# Patient Record
Sex: Female | Born: 1935 | Race: White | Hispanic: No | Smoking: Former smoker
Health system: Southern US, Community
[De-identification: ages and names within clinical notes are randomized; demographics above are authoritative.]

## PROBLEM LIST (undated history)

## (undated) DIAGNOSIS — I10 Essential (primary) hypertension: Secondary | ICD-10-CM

## (undated) DIAGNOSIS — Z950 Presence of cardiac pacemaker: Secondary | ICD-10-CM

## (undated) DIAGNOSIS — I442 Atrioventricular block, complete: Secondary | ICD-10-CM

## (undated) DIAGNOSIS — I259 Chronic ischemic heart disease, unspecified: Secondary | ICD-10-CM

## (undated) DIAGNOSIS — R011 Cardiac murmur, unspecified: Secondary | ICD-10-CM

## (undated) DIAGNOSIS — R0989 Other specified symptoms and signs involving the circulatory and respiratory systems: Secondary | ICD-10-CM

## (undated) DIAGNOSIS — I509 Heart failure, unspecified: Secondary | ICD-10-CM

## (undated) DIAGNOSIS — F0390 Unspecified dementia without behavioral disturbance: Secondary | ICD-10-CM

## (undated) HISTORY — DX: Other specified symptoms and signs involving the circulatory and respiratory systems: R09.89

## (undated) HISTORY — DX: Atrioventricular block, complete: I44.2

## (undated) HISTORY — DX: Presence of cardiac pacemaker: Z95.0

## (undated) HISTORY — DX: Cardiac murmur, unspecified: R01.1

## (undated) HISTORY — PX: EP IMPLANTABLE DEVICE: SHX172B

## (undated) HISTORY — PX: ABDOMINAL HYSTERECTOMY: SHX81

## (undated) HISTORY — PX: MOLE REMOVAL: SHX2046

## (undated) HISTORY — DX: Chronic ischemic heart disease, unspecified: I25.9

---

## 2010-11-30 DIAGNOSIS — H47019 Ischemic optic neuropathy, unspecified eye: Secondary | ICD-10-CM | POA: Insufficient documentation

## 2010-11-30 DIAGNOSIS — I447 Left bundle-branch block, unspecified: Secondary | ICD-10-CM | POA: Insufficient documentation

## 2010-11-30 DIAGNOSIS — E785 Hyperlipidemia, unspecified: Secondary | ICD-10-CM | POA: Insufficient documentation

## 2010-11-30 DIAGNOSIS — K5790 Diverticulosis of intestine, part unspecified, without perforation or abscess without bleeding: Secondary | ICD-10-CM | POA: Insufficient documentation

## 2010-11-30 DIAGNOSIS — Z9849 Cataract extraction status, unspecified eye: Secondary | ICD-10-CM | POA: Insufficient documentation

## 2010-11-30 DIAGNOSIS — M810 Age-related osteoporosis without current pathological fracture: Secondary | ICD-10-CM | POA: Insufficient documentation

## 2010-11-30 DIAGNOSIS — I10 Essential (primary) hypertension: Secondary | ICD-10-CM | POA: Insufficient documentation

## 2010-11-30 DIAGNOSIS — I2510 Atherosclerotic heart disease of native coronary artery without angina pectoris: Secondary | ICD-10-CM | POA: Insufficient documentation

## 2013-10-16 ENCOUNTER — Encounter: Payer: Self-pay | Admitting: Cardiology

## 2013-10-16 ENCOUNTER — Ambulatory Visit (INDEPENDENT_AMBULATORY_CARE_PROVIDER_SITE_OTHER): Payer: Medicare Other | Admitting: *Deleted

## 2013-10-16 ENCOUNTER — Ambulatory Visit (INDEPENDENT_AMBULATORY_CARE_PROVIDER_SITE_OTHER): Payer: Medicare Other | Admitting: Cardiology

## 2013-10-16 VITALS — BP 118/62 | HR 60 | Ht 63.0 in | Wt 108.0 lb

## 2013-10-16 DIAGNOSIS — Z950 Presence of cardiac pacemaker: Secondary | ICD-10-CM

## 2013-10-16 DIAGNOSIS — I358 Other nonrheumatic aortic valve disorders: Secondary | ICD-10-CM

## 2013-10-16 DIAGNOSIS — I442 Atrioventricular block, complete: Secondary | ICD-10-CM

## 2013-10-16 DIAGNOSIS — I359 Nonrheumatic aortic valve disorder, unspecified: Secondary | ICD-10-CM | POA: Insufficient documentation

## 2013-10-16 DIAGNOSIS — R0989 Other specified symptoms and signs involving the circulatory and respiratory systems: Secondary | ICD-10-CM | POA: Insufficient documentation

## 2013-10-16 DIAGNOSIS — I259 Chronic ischemic heart disease, unspecified: Secondary | ICD-10-CM

## 2013-10-16 LAB — MDC_IDC_ENUM_SESS_TYPE_INCLINIC
Battery Voltage: 2.75 V
Date Time Interrogation Session: 20150902162709
Lead Channel Pacing Threshold Amplitude: 1 V
Lead Channel Pacing Threshold Pulse Width: 0.5 ms
Lead Channel Sensing Intrinsic Amplitude: 0.8 mV
Lead Channel Setting Pacing Pulse Width: 0.5 ms
MDC IDC MSMT BATTERY IMPEDANCE: 2300 Ohm
MDC IDC MSMT LEADCHNL RV IMPEDANCE VALUE: 469 Ohm
MDC IDC PG SERIAL: 1785931
MDC IDC SET LEADCHNL RV SENSING SENSITIVITY: 2 mV

## 2013-10-16 NOTE — Patient Instructions (Addendum)
Your physician has requested that you have an echocardiogram. Echocardiography is a painless test that uses sound waves to create images of your heart. It provides your doctor with information about the size and shape of your heart and how well your heart's chambers and valves are working. This procedure takes approximately one hour. There are no restrictions for this procedure.  A chest x-ray takes a picture of the organs and structures inside the chest, including the heart, lungs, and blood vessels. This test can show several things, including, whether the heart is enlarges; whether fluid is building up in the lungs; and whether pacemaker / defibrillator leads are still in place. [facility] AT [facility]  Your physician has requested that you have a carotid duplex. This test is an ultrasound of the carotid arteries in your neck. It looks at blood flow through these arteries that supply the brain with blood. Allow one hour for this exam. There are no restrictions or special instructions.  Your physician recommends that you continue on your current medications as directed. Please refer to the Current Medication list given to you today.  NEED TO GET SET UP WITH EP PHYSICIAN TO FOLLOW YOUR PACEMAKER  Your physician wants you to follow-up in: 6 month OV/EKG You will receive a reminder letter in the mail two months in advance. If you don't receive a letter, please call our office to schedule the follow-up appointment.

## 2013-10-16 NOTE — Progress Notes (Signed)
Kathy Solomon Date of Birth:  [birth year] [facility] [address] [place], [place]  [address] [phone]        Fax   [phone]   History of Present Illness: This pleasant 80 year old woman is seen at the request of [facility] to establish cardiology care here in the [place] area.  The patient moved to [place] several years ago from [place].  She initially received her cardiology care at [facility] but because of the distance involved wishes to transfer her to [place].  [place]  The patient has a history of ischemic heart disease.  She had a myocardial infarction in 1994 treated with PCI in the [facility]  She has subsequently been on aspirin and Plavix.  She has a past history of sick sinus syndrome and has a St. Jude dual-chamber pacemaker implanted in [place] on 03/17/1998.  She has had her battery changed once since then.  She wishes to establish pacemaker/EP followup here in [place]. She has a past history of a known systolic murmur secondary to mild aortic valve disease.  She also has a past history of a known left carotid bruit previous carotid duplex in 2012 showing no significant stenosis. The patient has not been experiencing any recent cardiac symptoms.  She denies any chest pain or shortness of breath.  She walks for 30 minutes daily with a neighbor friend in the neighborhood or exercise.  She has occasional ankle edema.  She has not been having a dizziness or syncope.  She has not been having any racing of her heart or tachycardia.  She has had no TIA symptoms.  Her appetite is good and her weight has been stable. She quit drinking alcohol 25 years ago.  She quit smoking many years ago when she had her heart attack. Social history reveals that she moved to [place] to be near her daughter.  Her husband died of leukemia about  4 years ago.  Current Outpatient Prescriptions  Medication Sig Dispense Refill  . acetaminophen (TYLENOL) 500 MG tablet Take by mouth every 4 (four) hours as needed.       Marland Kitchen amLODipine (NORVASC) 10 MG tablet Take 10 mg by mouth daily.       Marland Kitchen aspirin 81 MG tablet Take 81 mg by mouth daily.       Marland Kitchen atenolol (TENORMIN) 100 MG tablet TAKE 1 TABLET BY MOUTH EVERY DAY      . Cholecalciferol (VITAMIN D) 400 UNITS capsule Take 1 tablet by mouth.      . clopidogrel (PLAVIX) 75 MG tablet Take 75 mg by mouth once.       Marland Kitchen lisinopril (PRINIVIL,ZESTRIL) 40 MG tablet Take 40 mg by mouth daily.       . Omega-3 Fatty Acids (FISH OIL) 1000 MG CAPS Take 1 capsule by mouth.      . simvastatin (ZOCOR) 20 MG tablet Take 20 mg by mouth daily at 6 PM.       . vitamin E 100 UNIT capsule Take 100 Units by mouth daily.        No current facility-administered medications for this visit.    Allergies not on file  Patient Active Problem List   Diagnosis Date Noted  . Ischemic heart disease 10/16/2013  . Pacemaker 10/16/2013  . Heart murmur, aortic 10/16/2013  . Left carotid bruit 10/16/2013    History  Smoking status  . Former Smoker  . Quit date: 05/16/1993  Smokeless tobacco  . Not on file    History  Alcohol Use: Not on file    Family history reveals her father died at 23 and mother died at 70.  Neither of them had coronary disease.  Review of Systems: Constitutional: no fever chills diaphoresis or fatigue or change in weight.  Head and neck: no hearing loss, no epistaxis, no photophobia or visual disturbance. Respiratory: No cough, shortness of breath or wheezing. Cardiovascular: No chest pain peripheral edema, palpitations. Gastrointestinal: No abdominal distention, no abdominal pain, no change in bowel habits hematochezia or melena. Genitourinary: No dysuria, no frequency, no urgency, no nocturia. Musculoskeletal:No arthralgias, no back pain, no gait disturbance or myalgias. Neurological:  No dizziness, no headaches, no numbness, no seizures, no syncope, no weakness, no tremors. Hematologic: No lymphadenopathy, no easy bruising. Psychiatric: No confusion, no hallucinations, no sleep disturbance.    Physical Exam: Filed Vitals:   10/16/13 1450  BP: 118/62  Pulse: 60  The patient appears to be in no distress.  Her daughter accompanied her to the office visit today  Head and neck exam reveals that the pupils are equal and reactive.  The extraocular movements are full.  There is no scleral icterus.  Mouth and pharynx are benign.  No lymphadenopathy.  There is a moderate left carotid systolic bruit.  The jugular venous pressure is normal.  Thyroid is not enlarged or tender.  Chest is clear to percussion and auscultation.  No rales or rhonchi.  Expansion of the chest is symmetrical.  The dual-chamber pacemaker is in the right upper chest.  Heart reveals no abnormal lift or heave.  First and second heart sounds are normal.  There is a grade 2/6 systolic ejection murmur at the aortic area.  The abdomen is soft and nontender.  Bowel sounds are normoactive.  There is no hepatosplenomegaly or mass.  There are no abdominal bruits.  Extremities reveal no phlebitis.  There is mild ankle edema probably secondary to amlodipine.  Pedal pulses are good.  There is no cyanosis or clubbing.  Neurologic exam is normal strength and no lateralizing weakness.  No sensory deficits.  Integument reveals no rash  EKG shows sinus bradycardia with paced ventricular rhythm  Assessment / Plan: 1. ischemic heart disease status post acute myocardial infarction with PCI in 1994.  No subsequent angina. 2. asymptomatic left carotid bruit 3. murmur of aortic stenosis 4. history of sick sinus syndrome with functioning dual-chamber pacemaker (St. Jude) implanted in 2000.  Patient uncertain when the generator was changed.  Plan: Continue current cardiac meds.  We will update her echocardiogram in regard to  her aortic stenosis.  We will update her carotid duplex in regard to her left carotid bruit.  We will arrange for her to be seen by EP to arrange for appropriate long-term pacemaker followup. We will check a chest x-ray. Recheck for an office visit and EKG in 6 months

## 2013-10-17 NOTE — Progress Notes (Signed)
Pacemaker check in clinic (checked by Delsa Grana). Normal device function. Threshold, sensing, and impedance consistent with previous measurements. Device programmed to maximize longevity. 25 mode switches (<1%)--max A 197--no EGMs. 1 high ventricular rates noted. 868 "PMT" episodes. Device programmed at appropriate safety margins. Histogram distribution appropriate for patient activity level. Device programmed to optimize intrinsic conduction. Estimated longevity 2-2.75 years. Patient will follow up with GT in 3 months to establish.

## 2013-10-29 ENCOUNTER — Other Ambulatory Visit (HOSPITAL_COMMUNITY): Payer: Medicare Other

## 2013-10-29 ENCOUNTER — Encounter (HOSPITAL_COMMUNITY): Payer: Medicare Other

## 2013-10-30 ENCOUNTER — Telehealth: Payer: Self-pay | Admitting: *Deleted

## 2013-10-30 NOTE — Telephone Encounter (Signed)
Message copied by Burnell Blanks on Wed Oct 30, 2013  8:29 AM ------      Message from: Omar Person B      Created: Wed Oct 16, 2013  4:31 PM      Regarding: tests       10-29-13 @ 10:30  Echo      10-29-13 @ 11:30 cartiod            -MEDICARE/MEDICARE PART A AND B         Cvg status: E-Verified         Subscriber: Aaronson,Caidyn         E-BLUE CROSS BLUE SHIELD/BCBS PPO OUT OF STATE         Cvg status: E-Verified         Subscriber: Aries,Patrese                      ------

## 2013-10-30 NOTE — Telephone Encounter (Signed)
OK 

## 2013-10-30 NOTE — Telephone Encounter (Signed)
Per scheduling desk daughter cancelled

## 2013-11-04 ENCOUNTER — Encounter: Payer: Self-pay | Admitting: Internal Medicine

## 2014-01-08 ENCOUNTER — Encounter: Payer: Self-pay | Admitting: *Deleted

## 2014-01-13 ENCOUNTER — Encounter: Payer: Self-pay | Admitting: *Deleted

## 2014-01-15 ENCOUNTER — Encounter: Payer: Self-pay | Admitting: Internal Medicine

## 2014-01-15 ENCOUNTER — Ambulatory Visit (INDEPENDENT_AMBULATORY_CARE_PROVIDER_SITE_OTHER): Payer: Medicare Other | Admitting: Internal Medicine

## 2014-01-15 VITALS — BP 168/70 | HR 61 | Ht 63.0 in | Wt 110.0 lb

## 2014-01-15 DIAGNOSIS — Z45018 Encounter for adjustment and management of other part of cardiac pacemaker: Secondary | ICD-10-CM

## 2014-01-15 DIAGNOSIS — I442 Atrioventricular block, complete: Secondary | ICD-10-CM

## 2014-01-15 DIAGNOSIS — I259 Chronic ischemic heart disease, unspecified: Secondary | ICD-10-CM

## 2014-01-15 DIAGNOSIS — I495 Sick sinus syndrome: Secondary | ICD-10-CM

## 2014-01-15 DIAGNOSIS — I10 Essential (primary) hypertension: Secondary | ICD-10-CM

## 2014-01-15 NOTE — Patient Instructions (Signed)

## 2014-01-15 NOTE — Progress Notes (Signed)
      No care team member to display   HPI  Kathy BravoMarion Solomon is a 80 y.o. female Seen to establish pacemaker follow-up for device implanted in [place] for complete hart block originally in 2000 with one interval generator replacement. She has a history of coronary artery disease with prior MI treated with PCI [facility] in [place]. She has known mild aortic valve disease.  An echo was ordered but not comsummated  she has no desire to undertake testing at her age. She denies shortness of breath or chest discomfort or peripheral edema  Blood pressures at home are in the 1:30-140 range   Old notes not availavbe   Past Medical History  Diagnosis Date  . Ischemic heart disease   . Heart murmur   . Left carotid bruit     Past Surgical History  Procedure Laterality Date  . Abdominal hysterectomy    . Ep implantable device      Current Outpatient Prescriptions  Medication Sig Dispense Refill  . acetaminophen (TYLENOL) 500 MG tablet Take by mouth every 4 (four) hours as needed.     Marland Kitchen. amLODipine (NORVASC) 10 MG tablet Take 10 mg by mouth daily.     Marland Kitchen. atenolol (TENORMIN) 100 MG tablet TAKE 1 TABLET BY MOUTH EVERY DAY    . Cholecalciferol (VITAMIN D) 400 UNITS capsule Take 1 tablet by mouth.    . clopidogrel (PLAVIX) 75 MG tablet Take 75 mg by mouth once.     Marland Kitchen. lisinopril (PRINIVIL,ZESTRIL) 40 MG tablet Take 40 mg by mouth daily.     . Omega-3 Fatty Acids (FISH OIL) 1000 MG CAPS Take 1 capsule by mouth.    . simvastatin (ZOCOR) 20 MG tablet Take 20 mg by mouth daily at 6 PM.     . vitamin E 100 UNIT capsule Take 100 Units by mouth daily.      No current facility-administered medications for this visit.    Allergies  Allergen Reactions  . Clonidine Derivatives Nausea Only and Anxiety     "feel like a zombie"   Soc hx  Moved to [place] following death of husband to be near her daughter and one grandson  No smoking  Fhx neg for compelte heart block Review of  Systems negative except from HPI and PMH  Physical Exam BP 168/70 mmHg  Pulse 61  Ht 5\' 3"  (1.6 m)  Wt 110 lb (49.896 kg)  BMI 19.49 kg/m2 Well developed and well nourished in no acute distress HENT normal E scleral and icterus clear Neck Supple JVP flat; carotids brisk and full Clear to ausculation Device pocket well healed; without hematoma or erythema.  There is no tethering *Regular rate and rhythm, no murmurs gallops or rub Soft with active bowel sounds No clubbing cyanosis  Edema Alert and oriented, grossly normal motor and sensory function Skin Warm and Dry  ECG demonstrates P synchronous pacing   Assessment and  Plan  Complete heart block  Pacemaker-St. Jude-VDD lead   Hypertension  We will establish tela- trace follow-up for her device. We'll plan to see her in one year.  Blood pressures at home are in the reasonable range. She is reminded that if the INR is 135-145 range consistently she should consider augmenting antihypertensive therapy

## 2014-01-16 LAB — MDC_IDC_ENUM_SESS_TYPE_INCLINIC
Battery Voltage: 2.74 V
Brady Statistic RV Percent Paced: 99 %
Date Time Interrogation Session: 20151202160604
Implantable Pulse Generator Model: 5826
Implantable Pulse Generator Serial Number: 1785931
Lead Channel Impedance Value: 451 Ohm
Lead Channel Pacing Threshold Amplitude: 1.125 V
Lead Channel Setting Sensing Sensitivity: 2 mV
MDC IDC MSMT BATTERY IMPEDANCE: 2500 Ohm
MDC IDC MSMT LEADCHNL RV PACING THRESHOLD PULSEWIDTH: 0.5 ms
MDC IDC SET LEADCHNL RV PACING PULSEWIDTH: 0.5 ms

## 2014-01-23 LAB — MDC_IDC_ENUM_SESS_TYPE_INCLINIC
Battery Impedance: 2500 Ohm
Battery Voltage: 2.74 V
Brady Statistic RV Percent Paced: 99 %
Date Time Interrogation Session: 20151202160604
Implantable Pulse Generator Model: 5826
Implantable Pulse Generator Serial Number: 1785931
Lead Channel Impedance Value: 451 Ohm
Lead Channel Pacing Threshold Pulse Width: 0.5 ms
Lead Channel Setting Pacing Pulse Width: 0.5 ms
Lead Channel Setting Sensing Sensitivity: 2 mV
MDC IDC MSMT LEADCHNL RA SENSING INTR AMPL: 0.7 mV
MDC IDC MSMT LEADCHNL RV PACING THRESHOLD AMPLITUDE: 1.125 V

## 2014-03-12 DIAGNOSIS — I442 Atrioventricular block, complete: Secondary | ICD-10-CM

## 2014-04-16 ENCOUNTER — Encounter: Payer: Self-pay | Admitting: Cardiology

## 2014-04-16 ENCOUNTER — Ambulatory Visit (INDEPENDENT_AMBULATORY_CARE_PROVIDER_SITE_OTHER): Payer: Medicare Other | Admitting: Cardiology

## 2014-04-16 VITALS — BP 156/60 | HR 61 | Ht 63.0 in | Wt 112.8 lb

## 2014-04-16 DIAGNOSIS — I495 Sick sinus syndrome: Secondary | ICD-10-CM

## 2014-04-16 DIAGNOSIS — I359 Nonrheumatic aortic valve disorder, unspecified: Secondary | ICD-10-CM

## 2014-04-16 DIAGNOSIS — I259 Chronic ischemic heart disease, unspecified: Secondary | ICD-10-CM

## 2014-04-16 DIAGNOSIS — I358 Other nonrheumatic aortic valve disorders: Secondary | ICD-10-CM

## 2014-04-16 DIAGNOSIS — R0989 Other specified symptoms and signs involving the circulatory and respiratory systems: Secondary | ICD-10-CM

## 2014-04-16 NOTE — Progress Notes (Signed)
Cardiology Office Note   Date:  04/16/2014   ID:  Kathy Solomon, DOB [birth year], MRN [id number]  PCP:  No primary care provider on file.  Cardiologist:   Cassell Clement, MD   No chief complaint on file.     History of Present Illness: Kathy Solomon is a 81 y.o. female who presents for a six-month follow-up office visit This pleasant 81 year old woman was initially seen at the request of [facility] to establish cardiology care here in the [place] area. The patient moved to [place] several years ago from [place]. She initially received her cardiology care at [facility] but because of the distance involved wishes to transfer her to [place]. [place] The patient has a history of ischemic heart disease. She had a myocardial infarction in 1994 treated with PCI in the [facility]. She has subsequently been on aspirin and Plavix. She has a past history of sick sinus syndrome and has a St. Jude dual-chamber pacemaker implanted in [place] on 03/17/1998. She has had her battery changed once since then.Her pacemaker is followed by Dr. Graciela Husbands She has a past history of a known systolic murmur secondary to mild aortic valve disease. She also has a past history of a known left carotid bruit previous carotid duplex in 2012 showing no significant stenosis.  She has not been having any TIA symptoms.  The patient has not been experiencing any recent cardiac symptoms. She denies any chest pain or shortness of breath. She walks for 30 minutes daily with a neighbor friend in the neighborhood or exercise. She has occasional ankle edema. She has not been having a dizziness or syncope. She has not been having any racing of her heart or tachycardia. She has had no TIA symptoms. Her appetite is good and her weight has been stable. She quit drinking alcohol 25 years ago. She  quit smoking many years ago when she had her heart attack. Social history reveals that she moved to [place] to be near her daughter.Her daughter drives her to her appointments. Her husband died of leukemia about 4 years ago. Her blood pressure is slightly high today.  She normally checks it at the drug store and it has been running normal most of the time.   Past Medical History  Diagnosis Date  . Ischemic heart disease   . Heart murmur   . Left carotid bruit   . Complete heart block   . Pacemaker - St Judes     VDD Lead     Past Surgical History  Procedure Laterality Date  . Abdominal hysterectomy    . Ep implantable device       Current Outpatient Prescriptions  Medication Sig Dispense Refill  . acetaminophen (TYLENOL) 500 MG tablet Take by mouth every 4 (four) hours as needed.     Marland Kitchen amLODipine (NORVASC) 10 MG tablet Take 10 mg by mouth daily.     Marland Kitchen atenolol (TENORMIN) 100 MG tablet TAKE 1 TABLET BY MOUTH EVERY DAY    . Cholecalciferol (VITAMIN D) 400 UNITS capsule Take 1 tablet by mouth.    . clopidogrel (PLAVIX) 75 MG tablet Take 75 mg by mouth once.     Marland Kitchen lisinopril (PRINIVIL,ZESTRIL) 40 MG tablet Take 40 mg by mouth daily.     . Omega-3 Fatty Acids (FISH OIL) 1000 MG CAPS Take 1 capsule by mouth.    . simvastatin (ZOCOR) 20 MG  tablet Take 20 mg by mouth daily at 6 PM.     . vitamin E 100 UNIT capsule Take 100 Units by mouth daily.      No current facility-administered medications for this visit.    Allergies:   Clonidine derivatives    Social History:  The patient  reports that she quit smoking about 20 years ago. She does not have any smokeless tobacco history on file.   Family History:  The patient's family history includes Hypertension in her father and mother.    ROS:  Please see the history of present illness.   Otherwise, review of systems are positive for none.   All other systems are reviewed and negative.    PHYSICAL EXAM: VS:  BP 156/60 mmHg   Pulse 61  Ht 5\' 3"  (1.6 m)  Wt 112 lb 12.8 oz (51.166 kg)  BMI 19.99 kg/m2 , BMI Body mass index is 19.99 kg/(m^2). GEN: Well nourished, well developed, in no acute distress HEENT: normal Neck: no JVD, carotid bruits, or masses Cardiac: RRR; no murmurs, rubs, or gallops,no edema  Respiratory:  clear to auscultation bilaterally, normal work of breathing GI: soft, nontender, nondistended, + BS MS: no deformity or atrophy Skin: warm and dry, no rash Neuro:  Strength and sensation are intact Psych: euthymic mood, full affect   EKG:  EKG is not ordered today.    Recent Labs: No results found for requested labs within last 365 days.    Lipid Panel No results found for: CHOL, TRIG, HDL, CHOLHDL, VLDL, LDLCALC, LDLDIRECT    Wt Readings from Last 3 Encounters:  04/16/14 112 lb 12.8 oz (51.166 kg)  01/15/14 110 lb (49.896 kg)  10/16/13 108 lb (48.988 kg)         ASSESSMENT AND PLAN:  1. ischemic heart disease status post acute myocardial infarction with PCI in 1994. No subsequent angina. 2. asymptomatic left carotid bruit 3. murmur of aortic stenosis, asymptomatic 4. history of sick sinus syndrome with functioning dual-chamber pacemaker (St. Jude) implanted in 2000. Patient uncertain when the generator was changed.  She has been told that her pacemaker probably has another 2 or 3 years of battery life.   Current medicines are reviewed at length with the patient today.  The patient does not have concerns regarding medicines.  The following changes have been made:  no change  Labs/ tests ordered today include:  No orders of the defined types were placed in this encounter.     Disposition:   FU with Dr. Patty SermonsBrackbill in 6 months office visit and EKG Signed, Cassell Clementhomas Nykiah Ma, MD  04/16/2014 1:23 PM    [facility] [address], [place], [place]  [address] Phone: [phone](336) [phone]; Fax: [phone]) [phone]

## 2014-04-16 NOTE — Patient Instructions (Signed)
Your physician recommends that you continue on your current medications as directed. Please refer to the Current Medication list given to you today.  Your physician wants you to follow-up in: 6 MONTH OV /EKG You will receive a reminder letter in the mail two months in advance. If you don't receive a letter, please call our office to schedule the follow-up appointment.  

## 2014-06-06 ENCOUNTER — Emergency Department (HOSPITAL_COMMUNITY)
Admission: EM | Admit: 2014-06-06 | Discharge: 2014-06-06 | Disposition: A | Discharge status: Home / Self Care | Payer: Medicare Other | Attending: Emergency Medicine | Admitting: Emergency Medicine

## 2014-06-06 ENCOUNTER — Encounter (HOSPITAL_COMMUNITY): Payer: Self-pay | Admitting: Emergency Medicine

## 2014-06-06 DIAGNOSIS — Z7982 Long term (current) use of aspirin: Secondary | ICD-10-CM | POA: Diagnosis not present

## 2014-06-06 DIAGNOSIS — Z7901 Long term (current) use of anticoagulants: Secondary | ICD-10-CM | POA: Diagnosis not present

## 2014-06-06 DIAGNOSIS — R001 Bradycardia, unspecified: Secondary | ICD-10-CM | POA: Diagnosis not present

## 2014-06-06 DIAGNOSIS — Z950 Presence of cardiac pacemaker: Secondary | ICD-10-CM | POA: Insufficient documentation

## 2014-06-06 DIAGNOSIS — M96830 Postprocedural hemorrhage and hematoma of a musculoskeletal structure following a musculoskeletal system procedure: Secondary | ICD-10-CM | POA: Diagnosis present

## 2014-06-06 DIAGNOSIS — I259 Chronic ischemic heart disease, unspecified: Secondary | ICD-10-CM | POA: Insufficient documentation

## 2014-06-06 DIAGNOSIS — Z9889 Other specified postprocedural states: Secondary | ICD-10-CM | POA: Diagnosis not present

## 2014-06-06 DIAGNOSIS — Y828 Other medical devices associated with adverse incidents: Secondary | ICD-10-CM | POA: Diagnosis not present

## 2014-06-06 DIAGNOSIS — Z87891 Personal history of nicotine dependence: Secondary | ICD-10-CM | POA: Insufficient documentation

## 2014-06-06 DIAGNOSIS — R58 Hemorrhage, not elsewhere classified: Secondary | ICD-10-CM

## 2014-06-06 DIAGNOSIS — Z79899 Other long term (current) drug therapy: Secondary | ICD-10-CM | POA: Diagnosis not present

## 2014-06-06 LAB — COMPREHENSIVE METABOLIC PANEL
ALBUMIN: 3.7 g/dL (ref 3.5–5.2)
ALK PHOS: 60 U/L (ref 39–117)
ALT: 20 U/L (ref 0–35)
ANION GAP: 13 (ref 5–15)
AST: 26 U/L (ref 0–37)
BILIRUBIN TOTAL: 0.5 mg/dL (ref 0.3–1.2)
BUN: 22 mg/dL (ref 6–23)
CALCIUM: 9.4 mg/dL (ref 8.4–10.5)
CO2: 22 mmol/L (ref 19–32)
Chloride: 101 mmol/L (ref 96–112)
Creatinine, Ser: 1.23 mg/dL — ABNORMAL HIGH (ref 0.50–1.10)
GFR calc non Af Amer: 39 mL/min — ABNORMAL LOW (ref >90)
GFR, EST AFRICAN AMERICAN: 45 mL/min — AB (ref >90)
Glucose, Bld: 127 mg/dL — ABNORMAL HIGH (ref 70–99)
Potassium: 3.8 mmol/L (ref 3.5–5.1)
SODIUM: 136 mmol/L (ref 135–145)
TOTAL PROTEIN: 6.7 g/dL (ref 6.0–8.3)

## 2014-06-06 LAB — CBC WITH DIFFERENTIAL/PLATELET
Basophils Absolute: 0 10*3/uL (ref 0.0–0.1)
Basophils Relative: 0 % (ref 0–1)
EOS ABS: 0.2 10*3/uL (ref 0.0–0.7)
EOS PCT: 3 % (ref 0–5)
HEMATOCRIT: 37.9 % (ref 36.0–46.0)
Hemoglobin: 12.7 g/dL (ref 12.0–15.0)
LYMPHS ABS: 1 10*3/uL (ref 0.7–4.0)
Lymphocytes Relative: 17 % (ref 12–46)
MCH: 30.7 pg (ref 26.0–34.0)
MCHC: 33.5 g/dL (ref 30.0–36.0)
MCV: 91.5 fL (ref 78.0–100.0)
MONOS PCT: 13 % — AB (ref 3–12)
Monocytes Absolute: 0.7 10*3/uL (ref 0.1–1.0)
NEUTROS PCT: 67 % (ref 43–77)
Neutro Abs: 3.7 10*3/uL (ref 1.7–7.7)
Platelets: 182 10*3/uL (ref 150–400)
RBC: 4.14 MIL/uL (ref 3.87–5.11)
RDW: 13.1 % (ref 11.5–15.5)
WBC: 5.6 10*3/uL (ref 4.0–10.5)

## 2014-06-06 MED ORDER — LIDOCAINE-EPINEPHRINE 2 %-1:100000 IJ SOLN
20.0000 mL | Freq: Once | INTRAMUSCULAR | Status: AC
  Administered 2014-06-06: 20 mL
  Filled 2014-06-06: qty 20

## 2014-06-06 NOTE — ED Notes (Signed)
Pt. reported bleeding this evening at right lateral face and anterior neck where moles were excised by her dermatologist yesterday , bleeding noted at arrival / dressing applied.

## 2014-06-06 NOTE — Discharge Instructions (Signed)

## 2014-06-06 NOTE — ED Notes (Signed)
EDP at bedside  

## 2014-06-06 NOTE — ED Provider Notes (Signed)
CSN: [id number]     Arrival date & time 06/06/14  0009 History   First MD Initiated Contact with Patient 06/06/14 0049     This chart was scribed for No att. providers found by Arlan OrganAshley Leger, ED Scribe. This patient was seen in room B14C/B14C and the patient's care was started 12:58 AM.   Chief Complaint  Patient presents with  . Post-op Problem   Patient is a 81 y.o. female presenting with general illness. The history is provided by the patient and a relative. No language interpreter was used.  Illness Location:  Bleeding from open wounds on anterior neck Severity:  Mild Onset quality:  Sudden Timing:  Constant Progression:  Unchanged Chronicity:  New Associated symptoms: no abdominal pain, no congestion, no cough, no diarrhea, no fever, no nausea, no rash and no shortness of breath   Risk factors:  Pt is on Plavix   HPI Comments: Marijean BravoMarion Belzer is a 81 y.o. female with a PMHx of ischemic heart disease and implanted St. Jude pacemaker who presents to the Emergency Department here for post-op problem this evening. Pt had 2 moles removed from lateral face and anterior neck yesterday at her dermatologists office. Ms. Cheryl FlashSchuler states she went to bed this evening at approximately 6:30 PM and woke in the middle of the night when she noticed bleeding from her neck at around 8:00 PM. Pt then called her daughter to bring her to emergency department for further evaluation. She is currently on Plavix daily. Pt denies any fever, chills, or abdominal pain. No reported pain at this time. Pt with known allergy to Clonidine.  Past Medical History  Diagnosis Date  . Ischemic heart disease   . Heart murmur   . Left carotid bruit   . Complete heart block   . Pacemaker - St Judes     VDD Lead    Past Surgical History  Procedure Laterality Date  . Abdominal hysterectomy    . Ep implantable device    . Mole excision     Family History  Problem Relation Age of Onset  . Hypertension Mother   .  Hypertension Father    History  Substance Use Topics  . Smoking status: Former Smoker    Quit date: 05/16/1993  . Smokeless tobacco: Not on file  . Alcohol Use: No   OB History    No data available     Review of Systems  Constitutional: Negative for fever.  HENT: Negative for congestion.   Respiratory: Negative for cough and shortness of breath.   Gastrointestinal: Negative for nausea, abdominal pain and diarrhea.  Skin: Positive for wound. Negative for rash.  All other systems reviewed and are negative.     Allergies  Clonidine derivatives  Home Medications   Prior to Admission medications   Medication Sig Start Date End Date Taking? Authorizing Provider  amLODipine (NORVASC) 10 MG tablet Take 10 mg by mouth daily.    Yes Historical Provider, MD  aspirin EC 81 MG tablet Take 81 mg by mouth daily.   Yes Historical Provider, MD  atenolol (TENORMIN) 100 MG tablet TAKE 1 TABLET BY MOUTH EVERY DAY 05/21/13  Yes Historical Provider, MD  Cholecalciferol (VITAMIN D) 400 UNITS capsule Take 1 tablet by mouth daily.    Yes Historical Provider, MD  clopidogrel (PLAVIX) 75 MG tablet Take 75 mg by mouth daily.    Yes Historical Provider, MD  lisinopril (PRINIVIL,ZESTRIL) 40 MG tablet Take 40 mg by mouth daily.  Yes Historical Provider, MD  Multiple Vitamin (MULTIVITAMIN WITH MINERALS) TABS tablet Take 1 tablet by mouth daily.   Yes Historical Provider, MD  Omega-3 Fatty Acids (FISH OIL) 1000 MG CAPS Take 1 capsule by mouth daily.    Yes Historical Provider, MD  simvastatin (ZOCOR) 20 MG tablet Take 20 mg by mouth daily.    Yes Historical Provider, MD   Triage Vitals: BP 143/59 mmHg  Pulse 66  Temp(Src) 98.2 F (36.8 C)  Resp 16  Wt 104 lb 9 oz (47.429 kg)  SpO2 95%   Physical Exam  Constitutional: She is oriented to person, place, and time. She appears well-developed and well-nourished.  HENT:  Head: Normocephalic and atraumatic.  Right Ear: External ear normal.  Left Ear:  External ear normal.  Eyes: Conjunctivae and EOM are normal. Pupils are equal, round, and reactive to light.  Neck: Normal range of motion. Neck supple.  2 venous oozing superficial punctures over anterior neck  Cardiovascular: Normal rate, regular rhythm, normal heart sounds and intact distal pulses.   Pulmonary/Chest: Effort normal and breath sounds normal.  Abdominal: Soft. Bowel sounds are normal. There is no tenderness.  Musculoskeletal: Normal range of motion.  Neurological: She is alert and oriented to person, place, and time.  Skin: Skin is warm and dry.  Vitals reviewed.   ED Course  Procedures (including critical care time)  DIAGNOSTIC STUDIES: Oxygen Saturation is 94% on RA, normal by my interpretation.    COORDINATION OF CARE: 12:57 AM-Discussed treatment plan with pt at bedside and pt agreed to plan.     LACERATION REPAIR Performed by: Augustine Radar MD Consent: Verbal consent obtained. Risks and benefits: risks, benefits and alternatives were discussed Patient identity confirmed: provided demographic data Time out performed prior to procedure Prepped and Draped in normal sterile fashion Wound explored Laceration Location: anterior neck  Laceration Length: 2 x 5mm No Foreign Bodies seen or palpated Anesthesia: local infiltration Local anesthetic: lidocaine 2% with epinephrine Anesthetic total: 8 ml Irrigation method: syringe Amount of cleaning: standard Skin closure: vicryl rapide Number of sutures or staples: 2 Technique: simple interrupted Patient tolerance: Patient tolerated the procedure well with no immediate complications.  Labs Review Labs Reviewed  CBC WITH DIFFERENTIAL/PLATELET - Abnormal; Notable for the following:    Monocytes Relative 13 (*)    All other components within normal limits  COMPREHENSIVE METABOLIC PANEL - Abnormal; Notable for the following:    Glucose, Bld 127 (*)    Creatinine, Ser 1.23 (*)    GFR calc non Af Amer 39 (*)    GFR  calc Af Amer 45 (*)    All other components within normal limits    Imaging Review No results found.   EKG Interpretation None      MDM   Final diagnoses:  Bleeding    81 y.o. female with pertinent PMH of CAD on plavix presents with oozing from biopsy sites on neck as above.  Attempted pressure, which was unsuccessful.  2 sutures placed over biopsy site with control of bleeding.  Discussed strict return precautions for infection.  DC home in stable condition  I have reviewed all laboratory and imaging studies if ordered as above  1. Bleeding           Mirian Mo, MD 06/10/14 [phone]

## 2014-06-06 NOTE — ED Notes (Signed)
Dr. Littie DeedsGentry at bedside applying pressure to stop bleeding

## 2014-06-06 NOTE — ED Notes (Signed)
The tech applied a clean dressing to front of the neck.

## 2014-06-11 ENCOUNTER — Encounter: Payer: Self-pay | Admitting: Internal Medicine

## 2014-06-11 DIAGNOSIS — I442 Atrioventricular block, complete: Secondary | ICD-10-CM | POA: Diagnosis not present

## 2014-07-21 ENCOUNTER — Ambulatory Visit (INDEPENDENT_AMBULATORY_CARE_PROVIDER_SITE_OTHER): Payer: Medicare Other | Admitting: *Deleted

## 2014-07-21 DIAGNOSIS — Z950 Presence of cardiac pacemaker: Secondary | ICD-10-CM | POA: Diagnosis not present

## 2014-07-21 DIAGNOSIS — I495 Sick sinus syndrome: Secondary | ICD-10-CM

## 2014-07-21 LAB — CUP PACEART INCLINIC DEVICE CHECK
Battery Impedance: 3000 Ohm
Battery Voltage: 2.72 V
Brady Statistic RV Percent Paced: 99 %
Lead Channel Setting Pacing Pulse Width: 0.5 ms
Lead Channel Setting Sensing Sensitivity: 2 mV
MDC IDC MSMT LEADCHNL RV IMPEDANCE VALUE: 446 Ohm
MDC IDC MSMT LEADCHNL RV PACING THRESHOLD AMPLITUDE: 1 V
MDC IDC MSMT LEADCHNL RV PACING THRESHOLD PULSEWIDTH: 0.5 ms
MDC IDC SESS DTM: 20160606121229
Pulse Gen Model: 5826
Pulse Gen Serial Number: 1785931

## 2014-07-21 NOTE — Progress Notes (Signed)
Pacemaker check in clinic. Normal device function. Threshold, sensing, impedances consistent with previous measurements. Device programmed to maximize longevity. 6 mode switches--- <1%, longest 8sec. Device programmed at appropriate safety margins. Histogram distribution appropriate for patient activity level. Device programmed to optimize intrinsic conduction. Estimated longevity 1.75-2.51yrs. ROV w/ SK in 63mo.

## 2014-08-05 ENCOUNTER — Telehealth: Payer: Self-pay | Admitting: Cardiology

## 2014-08-05 NOTE — Telephone Encounter (Signed)
New message      Pt called EMT last night because she felt strange.  They said she was ok.  Patient feels fine now except she is tired.  EMS told pt to call her doctor  Please call

## 2014-08-05 NOTE — Telephone Encounter (Signed)
Daughter called EMS to come evaluate patient Daughter stated she spoke with EMS and they said everything checked out ok Patient had been with her daughter at the hospital most of the day yesterday Today patient seems to be ok per daughter Advised daughter to contact PCP and if they feel cardiac related can get her seen this week, verbalized understanding

## 2014-08-05 NOTE — Telephone Encounter (Signed)
Spoke with patients daughter and she stated her mother called her in the middle of the night Her mother phoned her last night stating that she could not move her legs when she woke up, was able to when she spoke to her Per daughter her mother said her arms and legs were tingling Daughter had

## 2014-08-11 ENCOUNTER — Encounter: Payer: Self-pay | Admitting: Internal Medicine

## 2014-09-10 DIAGNOSIS — I442 Atrioventricular block, complete: Secondary | ICD-10-CM | POA: Diagnosis not present

## 2014-10-21 ENCOUNTER — Encounter: Payer: Self-pay | Admitting: *Deleted

## 2014-10-22 ENCOUNTER — Ambulatory Visit (INDEPENDENT_AMBULATORY_CARE_PROVIDER_SITE_OTHER): Payer: Medicare Other | Admitting: Cardiology

## 2014-10-22 ENCOUNTER — Encounter: Payer: Self-pay | Admitting: Cardiology

## 2014-10-22 VITALS — BP 122/60 | HR 63 | Ht 63.0 in | Wt 101.1 lb

## 2014-10-22 DIAGNOSIS — I495 Sick sinus syndrome: Secondary | ICD-10-CM

## 2014-10-22 DIAGNOSIS — I350 Nonrheumatic aortic (valve) stenosis: Secondary | ICD-10-CM | POA: Diagnosis not present

## 2014-10-22 DIAGNOSIS — I259 Chronic ischemic heart disease, unspecified: Secondary | ICD-10-CM

## 2014-10-22 DIAGNOSIS — R0989 Other specified symptoms and signs involving the circulatory and respiratory systems: Secondary | ICD-10-CM

## 2014-10-22 NOTE — Patient Instructions (Signed)
Medication Instructions:  Your physician recommends that you continue on your current medications as directed. Please refer to the Current Medication list given to you today.  Labwork: none  Testing/Procedures: Your physician has requested that you have a carotid duplex. This test is an ultrasound of the carotid arteries in your neck. It looks at blood flow through these arteries that supply the brain with blood. Allow one hour for this exam. There are no restrictions or special instructions.  Follow-Up: Your physician wants you to follow-up in: 6 month ov/ekg  You will receive a reminder letter in the mail two months in advance. If you don't receive a letter, please call our office to schedule the follow-up appointment.

## 2014-10-22 NOTE — Progress Notes (Signed)
Cardiology Office Note   Date:  10/22/2014   ID:  Kathy Solomon, DOB [birth year], MRN [id number]  PCP:  Kathy Dopp, MD  Cardiologist: Kathy Clement MD  No chief complaint on file.     History of Present Illness:   Kathy Solomon is a 81 y.o. female who presents for a six-month follow-up office visit This pleasant 81 year old woman was initially seen at the request of Kathy [facility] to establish cardiology Solomon here in the [place] area. The patient moved to [place] several years ago from [place]. She initially received her cardiology [facility] at [facility] but because of the distance involved wishes to transfer her to [facility]. [place] The patient has a history of ischemic heart disease. She had a myocardial infarction in 1994 treated with PCI in the [facility]. She has subsequently been on aspirin and Plavix. She has a past history of sick sinus syndrome and has a St. Jude dual-chamber pacemaker implanted in [place] on 03/17/1998. She has had her battery changed once since then.Her pacemaker is followed by Kathy Solomon She has a past history of a known systolic murmur secondary to mild aortic valve disease. She also has a past history of a known left carotid bruit previous carotid duplex in 2012 showing no significant stenosis. She has not been having any TIA symptoms. The patient has not been experiencing any recent cardiac symptoms. She denies any chest pain or shortness of breath. She walks for 30 minutes daily with a neighbor friend in the neighborhood or exercise. She has occasional ankle edema. She has not been having a dizziness or syncope. She has not been having any racing of her heart or tachycardia. She has had no TIA symptoms. Her appetite is good and her weight has been stable. She quit drinking alcohol 25 years ago. She quit smoking many  years ago when she had her heart attack. Social history reveals that she moved to [place] to be near her daughter.Her daughter drives her to her appointments. Her husband died of leukemia about 4 years ago. Her blood pressure is slightly high today. She normally checks it at the drug store and it has been running normal most of the time.  Past Medical History  Diagnosis Date  . Ischemic heart disease   . Heart murmur   . Left carotid bruit   . Complete heart block   . Pacemaker - St Judes     VDD Lead     Past Surgical History  Procedure Laterality Date  . Abdominal hysterectomy    . Ep implantable device    . Mole removal       Current Outpatient Prescriptions  Medication Sig Dispense Refill  . amLODipine (NORVASC) 10 MG tablet Take 10 mg by mouth daily.     Marland Kitchen aspirin EC 81 MG tablet Take 81 mg by mouth daily.    Marland Kitchen atenolol (TENORMIN) 100 MG tablet TAKE 1 TABLET BY MOUTH EVERY DAY    . Cholecalciferol (VITAMIN D) 400 UNITS capsule Take 1 tablet by mouth daily.     . clopidogrel (PLAVIX) 75 MG tablet Take 75 mg by mouth daily.     Marland Kitchen lisinopril (PRINIVIL,ZESTRIL) 40 MG tablet Take 40 mg by mouth daily.     . Multiple Vitamin (MULTIVITAMIN WITH MINERALS) TABS tablet Take 1 tablet by mouth daily.    . Omega-3 Fatty Acids (FISH OIL) 1000 MG CAPS  Take 1 capsule by mouth daily.     . simvastatin (ZOCOR) 20 MG tablet Take 20 mg by mouth daily.      No current facility-administered medications for this visit.    Allergies:   Clonidine derivatives    Social History:  The patient  reports that she quit smoking about 21 years ago. She does not have any smokeless tobacco history on file. She reports that she does not drink alcohol or use illicit drugs.   Family History:  The patient's family history includes Hypertension in her father and mother.    ROS:  Please see the history of present illness.   Otherwise, review of systems are positive for none.   All other systems are  reviewed and negative.    PHYSICAL EXAM: VS:  BP 122/60 mmHg  Pulse 63  Ht  (1.6 m)  Wt 101 lb 1.9 oz (45.868 kg)  BMI 17.92 kg/m2 , BMI Body mass index is 17.92 kg/(m^2). GEN: Well nourished, well developed, in no acute distress HEENT: normal Neck: There is a prominent left carotid bruit Cardiac: RRR;, rubs, or gallops,no edema .  There is a soft systolic basilar murmur Respiratory:  clear to auscultation bilaterally, normal work of breathing GI: soft, nontender, nondistended, + BS MS: no deformity or atrophy Skin: warm and dry, no rash Neuro:  Strength and sensation are intact Psych: euthymic mood, full affect   EKG:  EKG is ordered today. The ekg ordered today demonstrates paced ventricular rhythm   Recent Labs: 06/06/2014: ALT 20; BUN 22; Creatinine, Ser 1.23*; Hemoglobin 12.7; Platelets 182; Potassium 3.8; Sodium 136    Lipid Panel No results found for: CHOL, TRIG, HDL, CHOLHDL, VLDL, LDLCALC, LDLDIRECT    Wt Readings from Last 3 Encounters:  10/22/14 101 lb 1.9 oz (45.868 kg)  06/06/14 104 lb 9 oz (47.429 kg)  04/16/14 112 lb 12.8 oz (51.166 kg)         ASSESSMENT AND PLAN:   Expand All Collapse All      Cardiology Office Note   Date: 04/16/2014   ID: Kathy Solomon, DOB October 30, 1927, MRN [id number]  PCP: No primary Solomon provider on file. Cardiologist: Kathy Clement, MD   No chief complaint on file.    History of Present Illness: Kathy Solomon is a 81 y.o. female who presents for a six-month follow-up office visit This pleasant 81 year old woman was initially seen at the request of Kathy Solomon to establish cardiology Solomon here in the [place] area. The patient moved to [place] several years ago from [place]. She initially received her cardiology [facility] at [facility] but because of the distance involved wishes to transfer her to [place]. [place] The patient has a history of ischemic heart disease. She had a myocardial infarction in 1994 treated with PCI in the [place] [facility]. She has subsequently been on aspirin and Plavix. She has a past history of sick sinus syndrome and has a St. Jude dual-chamber pacemaker implanted in [place] on 03/17/1998. She has had her battery changed once since then.Her pacemaker is followed by Kathy Solomon She has a past history of a known systolic murmur secondary to mild aortic valve disease. She also has a past history of a known left carotid bruit previous carotid duplex in 2012 showing no significant stenosis. She has not been having any TIA symptoms. The patient has not been experiencing any recent cardiac symptoms. She  denies any chest pain or shortness of breath. She walks for 30 minutes daily with a neighbor friend in the neighborhood or exercise. She has occasional ankle edema. She has not been having a dizziness or syncope. She has not been having any racing of her heart or tachycardia. She has had no TIA symptoms. Her appetite is good and her weight has been stable. She quit drinking alcohol 25 years ago. She quit smoking many years ago when she had her heart attack. Social history reveals that she moved to [place] to be near her daughter.Her daughter drives her to her appointments. Her husband died of leukemia about 4 years ago. Her blood pressure is slightly high today. She normally checks it at the drug store and it has been running normal most of the time.   Past Medical History  Diagnosis Date  . Ischemic heart disease   . Heart murmur   . Left carotid bruit   . Complete heart block   . Pacemaker - St Judes     VDD Lead     Past Surgical History  Procedure Laterality Date  . Abdominal hysterectomy    . Ep implantable device       Current Outpatient Prescriptions  Medication Sig  Dispense Refill  . acetaminophen (TYLENOL) 500 MG tablet Take by mouth every 4 (four) hours as needed.     Marland Kitchen amLODipine (NORVASC) 10 MG tablet Take 10 mg by mouth daily.     Marland Kitchen atenolol (TENORMIN) 100 MG tablet TAKE 1 TABLET BY MOUTH EVERY DAY    . Cholecalciferol (VITAMIN D) 400 UNITS capsule Take 1 tablet by mouth.    . clopidogrel (PLAVIX) 75 MG tablet Take 75 mg by mouth once.     Marland Kitchen lisinopril (PRINIVIL,ZESTRIL) 40 MG tablet Take 40 mg by mouth daily.     . Omega-3 Fatty Acids (FISH OIL) 1000 MG CAPS Take 1 capsule by mouth.    . simvastatin (ZOCOR) 20 MG tablet Take 20 mg by mouth daily at 6 PM.     . vitamin E 100 UNIT capsule Take 100 Units by mouth daily.      No current facility-administered medications for this visit.    Allergies: Clonidine derivatives    Social History: The patient  reports that she quit smoking about 20 years ago. She does not have any smokeless tobacco history on file.   Family History: The patient's family history includes Hypertension in her father and mother.    ROS: Please see the history of present illness. Otherwise, review of systems are positive for none. All other systems are reviewed and negative.    PHYSICAL EXAM: VS: BP 156/60 mmHg  Pulse 61  Ht 5\' 3"  (1.6 m)  Wt 112 lb 12.8 oz (51.166 kg)  BMI 19.99 kg/m2 , BMI Body mass index is 19.99 kg/(m^2). GEN: Well nourished, well developed, in no acute distress  HEENT: normal  Neck: no JVD, carotid bruits, or masses Cardiac: RRR; no murmurs, rubs, or gallops,no edema  Respiratory: clear to auscultation bilaterally, normal work of breathing GI: soft, nontender, nondistended, + BS MS: no deformity or atrophy  Skin: warm and dry, no rash Neuro: Strength and sensation are intact Psych: euthymic mood, full affect   EKG: EKG is not ordered today.    Recent Labs: No results found for requested labs within last 365 days.     Lipid Panel  Labs (Brief)    No results found for: CHOL, TRIG, HDL, CHOLHDL, VLDL, LDLCALC,  LDLDIRECT     Wt Readings from Last 3 Encounters:  04/16/14 112 lb 12.8 oz (51.166 kg)  01/15/14 110 lb (49.896 kg)  10/16/13 108 lb (48.988 kg)         ASSESSMENT AND PLAN:  1. ischemic heart disease status post acute myocardial infarction with PCI in 1994. No subsequent angina. 2. asymptomatic left carotid bruit 3. murmur of aortic stenosis, asymptomatic 4. history of sick sinus syndrome with functioning dual-chamber pacemaker (St. Jude) implanted in 2000. Patient uncertain when the generator was changed. She has been told that her pacemaker probably has another 2 or 3 years of battery life.          Current medicines are reviewed at length with the patient today.  The patient does not have concerns regarding medicines.  The following changes have been made:  no change  Labs/ tests ordered today include:   Orders Placed This Encounter  Procedures  . EKG 12-Lead      Signed, Kathy Clement MD 10/22/2014 6:58 PM    [facility] [address] [place], [place], [place]  [address] Phone: [phone]; Fax: [phone]

## 2014-10-22 NOTE — Progress Notes (Signed)
Cardiology Office Note   Date:  10/22/2014   ID:  Kathy Solomon, DOB 07-14-1927, MRN [id number]  PCP:  Devra Dopp, MD  Cardiologist: Cassell Clement MD  No chief complaint on file.     History of Present Illness: Kathy Solomon is a 81 y.o. female who presents for a six-month follow-up visit. This pleasant 81 year old woman was initially seen at the request of [facility] to establish cardiology care here in the [place] area. The patient moved to [place] several years ago from [place]. She initially received her cardiology care at [facility] but because of the distance involved wishes to transfer her to [place]. [place] The patient has a history of ischemic heart disease. She had a myocardial infarction in 1994 treated with PCI in the [facility]. She has subsequently been on aspirin and Plavix. She has a past history of sick sinus syndrome and has a St. Jude dual-chamber pacemaker implanted in [place] on 03/17/1998. She has had her battery changed once since then.Her pacemaker is followed by Dr. Graciela Husbands She has a past history of a known systolic murmur secondary to mild aortic valve disease. She also has a past history of a known left carotid bruit previous carotid duplex in 2012 showing no significant stenosis. She has not been having any TIA symptoms. The patient has not been experiencing any recent cardiac symptoms. She denies any chest pain or shortness of breath. She walks for 30 minutes daily with a neighbor friend in the neighborhood or exercise. She has occasional ankle edema. She has not been having a dizziness or syncope. She has not been having any racing of her heart or tachycardia. She has had no TIA symptoms. Since last visit she has lost 9 pounds.  She states that her appetite is unchanged and is good.  She feels well.  Past  Medical History  Diagnosis Date  . Ischemic heart disease   . Heart murmur   . Left carotid bruit   . Complete heart block   . Pacemaker - St Judes     VDD Lead     Past Surgical History  Procedure Laterality Date  . Abdominal hysterectomy    . Ep implantable device    . Mole removal       Current Outpatient Prescriptions  Medication Sig Dispense Refill  . amLODipine (NORVASC) 10 MG tablet Take 10 mg by mouth daily.     Marland Kitchen aspirin EC 81 MG tablet Take 81 mg by mouth daily.    Marland Kitchen atenolol (TENORMIN) 100 MG tablet TAKE 1 TABLET BY MOUTH EVERY DAY    . Cholecalciferol (VITAMIN D) 400 UNITS capsule Take 1 tablet by mouth daily.     . clopidogrel (PLAVIX) 75 MG tablet Take 75 mg by mouth daily.     Marland Kitchen lisinopril (PRINIVIL,ZESTRIL) 40 MG tablet Take 40 mg by mouth daily.     . Multiple Vitamin (MULTIVITAMIN WITH MINERALS) TABS tablet Take 1 tablet by mouth daily.    . Omega-3 Fatty Acids (FISH OIL) 1000 MG CAPS Take 1 capsule by mouth daily.     . simvastatin (ZOCOR) 20 MG tablet Take 20 mg by mouth daily.      No current facility-administered medications for this visit.    Allergies:   Clonidine derivatives    Social History:  The patient  reports that she quit smoking about 21 years ago. She does  not have any smokeless tobacco history on file. She reports that she does not drink alcohol or use illicit drugs.   Family History:  The patient's family history includes Hypertension in her father and mother.    ROS:  Please see the history of present illness.   Otherwise, review of systems are positive for none.   All other systems are reviewed and negative.    PHYSICAL EXAM: VS:  BP 122/60 mmHg  Pulse 63  Ht  (1.6 m)  Wt 101 lb 1.9 oz (45.868 kg)  BMI 17.92 kg/m2 , BMI Body mass index is 17.92 kg/(m^2). GEN: Well nourished, well developed, in no acute distress HEENT: normal Neck: There is a prominent left carotid bruit. Cardiac: RRR; there is a soft systolic  murmur. Respiratory:  clear to auscultation bilaterally, normal work of breathing GI: soft, nontender, nondistended, + BS MS: no deformity or atrophy Skin: warm and dry, no rash Neuro:  Strength and sensation are intact Psych: euthymic mood, full affect   EKG:  EKG is ordered today. The ekg ordered today demonstrates the patient has a ventricular pacemaker tracking her P waves.   Recent Labs: 06/06/2014: ALT 20; BUN 22; Creatinine, Ser 1.23*; Hemoglobin 12.7; Platelets 182; Potassium 3.8; Sodium 136    Lipid Panel No results found for: CHOL, TRIG, HDL, CHOLHDL, VLDL, LDLCALC, LDLDIRECT    Wt Readings from Last 3 Encounters:  10/22/14 101 lb 1.9 oz (45.868 kg)  06/06/14 104 lb 9 oz (47.429 kg)  04/16/14 112 lb 12.8 oz (51.166 kg)         ASSESSMENT AND PLAN:  1. ischemic heart disease status post acute myocardial infarction with PCI in 1994. No subsequent angina. 2. asymptomatic left carotid bruit 3. murmur of aortic stenosis, asymptomatic 4. history of sick sinus syndrome with functioning dual-chamber pacemaker (St. Jude) implanted in 2000. Patient uncertain when the generator was changed. She has been told that her pacemaker probably has another 2 or 3 years of battery life.   Current medicines are reviewed at length with the patient today.  The patient does not have concerns regarding medicines.  The following changes have been made:  no change  Labs/ tests ordered today include:   Orders Placed This Encounter  Procedures  . EKG 12-Lead   Disposition: We will get a update of her carotid duplex of her left carotid bruits.  Continue current medication.  Recheck in 6 months for follow-up office visit and EKG   Signed, Cassell Clement MD 10/22/2014 6:51 PM    [facility] [address] [place], [place], [place]  [address] Phone: [phone]; Fax: [phone]

## 2014-11-12 ENCOUNTER — Ambulatory Visit (HOSPITAL_COMMUNITY)
Admission: RE | Admit: 2014-11-12 | Discharge: 2014-11-12 | Disposition: A | Discharge status: Home / Self Care | Payer: Medicare Other | Source: Ambulatory Visit | Attending: Cardiology | Admitting: Cardiology

## 2014-11-12 DIAGNOSIS — R0989 Other specified symptoms and signs involving the circulatory and respiratory systems: Secondary | ICD-10-CM | POA: Diagnosis not present

## 2014-11-12 DIAGNOSIS — I6523 Occlusion and stenosis of bilateral carotid arteries: Secondary | ICD-10-CM | POA: Insufficient documentation

## 2014-11-24 ENCOUNTER — Telehealth: Payer: Self-pay | Admitting: *Deleted

## 2014-11-24 NOTE — Telephone Encounter (Signed)
Carotid doppler done 11/12/14 reviewed by  Dr. Patty Sermons

## 2014-11-24 NOTE — Telephone Encounter (Signed)
Per  Dr. Patty Sermons follow up in 2 years  Patient aware

## 2014-12-10 ENCOUNTER — Telehealth: Payer: Self-pay | Admitting: Cardiology

## 2014-12-10 DIAGNOSIS — I442 Atrioventricular block, complete: Secondary | ICD-10-CM

## 2014-12-10 NOTE — Telephone Encounter (Signed)
Vivan w/ heartcare called and stated that pt had abnormal device interrogation of oversensing. She is faxing report.

## 2014-12-30 NOTE — Telephone Encounter (Signed)
[facility].  Lupita LeashDonna states that the fax never went through and that she will fax it again today.  Will place in Dr. Odessa FlemingKlein's folder for review when transmission received.

## 2014-12-30 NOTE — Telephone Encounter (Signed)
Transmission received via fax.  Report placed in Dr. Odessa FlemingKlein's red folder for review.

## 2015-02-10 ENCOUNTER — Encounter: Payer: Self-pay | Admitting: *Deleted

## 2015-02-11 ENCOUNTER — Telehealth: Payer: Self-pay | Admitting: Internal Medicine

## 2015-02-11 NOTE — Telephone Encounter (Signed)
Confirmed pt's upcoming appt w/SK. Patient states that she is unsure if she will be able to keep this appt, but needs to talk to her daughter about it first. I told patient that if she needed to change the appt she could speak to a scheduler about this. Patient voiced understanding.

## 2015-02-11 NOTE — Telephone Encounter (Signed)
°  1. Has your device fired? no  2. Is you device beeping? no  3. Are you experiencing draining or swelling at device site? no  4. Are you calling to see if we received your device transmission? no  5. Have you passed out? No  Pt has questions concerning when she need to transmit her device.

## 2015-03-03 ENCOUNTER — Encounter: Payer: Self-pay | Admitting: Internal Medicine

## 2015-03-03 ENCOUNTER — Ambulatory Visit (INDEPENDENT_AMBULATORY_CARE_PROVIDER_SITE_OTHER): Payer: Medicare Other | Admitting: Internal Medicine

## 2015-03-03 VITALS — BP 126/62 | HR 68 | Ht 64.0 in | Wt 106.1 lb

## 2015-03-03 DIAGNOSIS — Z950 Presence of cardiac pacemaker: Secondary | ICD-10-CM

## 2015-03-03 DIAGNOSIS — T85618A Breakdown (mechanical) of other specified internal prosthetic devices, implants and grafts, initial encounter: Secondary | ICD-10-CM

## 2015-03-03 DIAGNOSIS — Z45018 Encounter for adjustment and management of other part of cardiac pacemaker: Secondary | ICD-10-CM | POA: Diagnosis not present

## 2015-03-03 DIAGNOSIS — I259 Chronic ischemic heart disease, unspecified: Secondary | ICD-10-CM | POA: Diagnosis not present

## 2015-03-03 DIAGNOSIS — I442 Atrioventricular block, complete: Secondary | ICD-10-CM

## 2015-03-03 DIAGNOSIS — T85698A Other mechanical complication of other specified internal prosthetic devices, implants and grafts, initial encounter: Secondary | ICD-10-CM

## 2015-03-03 NOTE — Progress Notes (Signed)
Patient Care Team: Devra Dopp, MD as PCP - General (Family Medicine)   HPI  Kathy Solomon is a 82 y.o. female Seen to establish pacemaker follow-up for device implanted in [place] for complete hart block originally in 2000 with one interval generator replacement. She has a history of coronary artery disease with prior MI treated with PCI [facility] in [place]. She has known mild aortic valve disease.  An echo was ordered but not comsummated  she has no desire to undertake testing at her age. She denies shortness of breath or chest discomfort or peripheral edema  She has no bleeding issues. Reviewing of her medications, she does not take aspirin.   Past Medical History  Diagnosis Date  . Ischemic heart disease   . Heart murmur   . Left carotid bruit   . Complete heart block (HCC)   . Pacemaker - St Judes     VDD Lead     Past Surgical History  Procedure Laterality Date  . Abdominal hysterectomy    . Ep implantable device    . Mole removal      Current Outpatient Prescriptions  Medication Sig Dispense Refill  . amLODipine (NORVASC) 10 MG tablet Take 10 mg by mouth daily.     Marland Kitchen aspirin EC 81 MG tablet Take 81 mg by mouth daily.    Marland Kitchen atenolol (TENORMIN) 100 MG tablet TAKE 1 TABLET BY MOUTH EVERY DAY    . Cholecalciferol (VITAMIN D) 400 UNITS capsule Take 1 tablet by mouth daily.     . clopidogrel (PLAVIX) 75 MG tablet Take 75 mg by mouth daily.     Marland Kitchen lisinopril (PRINIVIL,ZESTRIL) 40 MG tablet Take 40 mg by mouth daily.     . Multiple Vitamin (MULTIVITAMIN WITH MINERALS) TABS tablet Take 1 tablet by mouth daily.    . Omega-3 Fatty Acids (FISH OIL) 1000 MG CAPS Take 1 capsule by mouth daily.     . simvastatin (ZOCOR) 20 MG tablet Take 20 mg by mouth daily.      No current facility-administered medications for this visit.    Allergies  Allergen Reactions  . Clonidine Derivatives Nausea Only and Anxiety     "feel like a zombie"   Soc hx   Moved to [place] following death of husband to be near her daughter and one grandson  No smoking  Fhx neg for compelte heart block Review of Systems negative except from HPI and PMH  Physical Exam BP 126/62 mmHg  Pulse 68  Ht  (1.626 m)  Wt 106 lb 1.9 oz (48.136 kg)  BMI 18.21 kg/m2 Well developed and well nourished in no acute distress HENT normal E scleral and icterus clear Neck Supple JVP flat; carotids brisk and full Clear to ausculation Device pocket well healed; without hematoma or erythema.  It is right sided  There is no tethering *Regular rate and rhythm, no murmurs gallops or rub Soft with active bowel sounds No clubbing cyanosis  Edema Alert and oriented, grossly normal motor and sensory function Skin Warm and Dry  ECG demonstrates P synchronous pacing   Assessment and  Plan  Complete heart block  Pacemaker-St. Jude-VDD lead   Hypertension  Pacemaker mediated tachycardia   A number of abnormalities were noted on her deivce. There was some inhibition of ventricular pacing;. We have reprogrammed ventricular sensitivity from 2--3. There was some tracking into the ventricular refractoriness; he has a ventricular rate. Bleeding issues are moved. She does  not take aspirin. This is appropriate. She does not recall the indication for her stent.  BP well controlled  Without symptoms of ischemia  On statin, albeit low dse  Will need repeat lipids at next visit-maybe although I am not sure of the benefit in an octogenerian

## 2015-03-03 NOTE — Patient Instructions (Addendum)
Medication Instructions: - no changes  Labwork: - none  Procedures/Testing: - none  Follow-Up: - Your physician wants you to follow-up in: 6 months with the Device Clinic & 1 year with Dr. Klein. You will receive a reminder letter in the mail two months in advance. If you don't receive a letter, please call our office to schedule the follow-up appointment.  Any Additional Special Instructions Will Be Listed Below (If Applicable).   

## 2015-03-06 ENCOUNTER — Telehealth: Payer: Self-pay | Admitting: *Deleted

## 2015-03-06 LAB — CUP PACEART INCLINIC DEVICE CHECK
Date Time Interrogation Session: 20170117183456
Lead Channel Pacing Threshold Pulse Width: 0.5 ms
Lead Channel Sensing Intrinsic Amplitude: 0.5 mV
Lead Channel Setting Sensing Sensitivity: 4 mV
MDC IDC LEAD IMPLANT DT: 20000201
MDC IDC LEAD LOCATION: 753860
MDC IDC MSMT BATTERY IMPEDANCE: 4100 Ohm
MDC IDC MSMT BATTERY VOLTAGE: 2.73 V
MDC IDC MSMT LEADCHNL RV IMPEDANCE VALUE: 423 Ohm
MDC IDC MSMT LEADCHNL RV PACING THRESHOLD AMPLITUDE: 1.25 V
MDC IDC PG SERIAL: 1785931
MDC IDC SET LEADCHNL RV PACING PULSEWIDTH: 0.5 ms

## 2015-03-06 NOTE — Telephone Encounter (Signed)
Mel returned call.  Scheduled patient for TTM on 06/03/15 at 9:00am per patient request.  Called patient to update her.  She is aware of TTM date and time.  She is aware that if she would like a letter reminder from [facility] for the next TTM appointment, she has to request it.  Patient is appreciative of call and denies additional questions or concerns at this time.

## 2015-03-06 NOTE — Telephone Encounter (Signed)
Called Mel at [facility] to set up patient's TTM transmission schedule per patient request.  [facility] with device clinic phone number.

## 2015-04-13 ENCOUNTER — Encounter: Payer: Self-pay | Admitting: Cardiology

## 2015-04-13 ENCOUNTER — Ambulatory Visit (INDEPENDENT_AMBULATORY_CARE_PROVIDER_SITE_OTHER): Payer: Medicare Other | Admitting: Cardiology

## 2015-04-13 VITALS — BP 140/70 | HR 64 | Ht 64.0 in | Wt 106.4 lb

## 2015-04-13 DIAGNOSIS — R0989 Other specified symptoms and signs involving the circulatory and respiratory systems: Secondary | ICD-10-CM

## 2015-04-13 DIAGNOSIS — I442 Atrioventricular block, complete: Secondary | ICD-10-CM | POA: Diagnosis not present

## 2015-04-13 DIAGNOSIS — I259 Chronic ischemic heart disease, unspecified: Secondary | ICD-10-CM | POA: Diagnosis not present

## 2015-04-13 DIAGNOSIS — I350 Nonrheumatic aortic (valve) stenosis: Secondary | ICD-10-CM | POA: Diagnosis not present

## 2015-04-13 NOTE — Patient Instructions (Signed)
Medication Instructions:  Your physician recommends that you continue on your current medications as directed. Please refer to the Current Medication list given to you today.  Labwork: none  Testing/Procedures: none  Follow-Up: Your physician wants you to follow-up in: 6 month ov/ekg with Dr Nahser  You will receive a reminder letter in the mail two months in advance. If you don't receive a letter, please call our office to schedule the follow-up appointment.  If you need a refill on your cardiac medications before your next appointment, please call your pharmacy.  

## 2015-04-13 NOTE — Progress Notes (Signed)
Cardiology Office Note   Date:  04/13/2015   ID:  Kathy Solomon, DOB Sep 19, 1927, MRN [id number]  PCP:  Devra Dopp, MD  Cardiologist: Cassell Clement MD  Chief Complaint  Patient presents with  . scheduled follow up    blood pressure      History of Present Illness: Kathy Solomon is a 82 y.o. female who presents for scheduled 6 month follow-up visit  This pleasant 82 year old woman was initially seen at the request of [facility] to establish cardiology care here in the [place] area. The patient moved to [place] several years ago from [place]. She initially received her cardiology care at [facility] but because of the distance involved wishes to transfer her to [place]. [place] The patient has a history of ischemic heart disease. She had a myocardial infarction in 1994 treated with PCI in the [facility]. She has subsequently been on aspirin and Plavix. She has a past history of sick sinus syndrome and has a St. Jude dual-chamber pacemaker implanted in [place] on 03/17/1998. She has had her battery changed once since then.Her pacemaker is followed by Dr. Graciela Husbands. She has a past history of a known systolic murmur secondary to mild aortic valve disease. She also has a past history of a known left carotid bruit previous carotid duplex in 2012 showing no significant stenosis. She has not been having any TIA symptoms. The patient has not been experiencing any recent cardiac symptoms. She denies any chest pain or shortness of breath. She walks for 30 minutes daily with a neighbor friend in the neighborhood or exercise. She has occasional ankle edema. She has not been having a dizziness or syncope. She has not been having any racing of her heart or tachycardia. She has had no TIA symptoms. Her appetite is good and her weight has been stable. She  quit drinking alcohol 25 years ago. She quit smoking many years ago when she had her heart attack. Social history reveals that she moved to [place] to be near her daughter.Her daughter drives her to her appointments. Her husband died of leukemia about 4 years ago. Since last visit she had carotid Dopplers done on 11/12/14 which showed less than 39% stenosis bilaterally.  A repeat study in 2 years was recommended.  She has not been experiencing any TIA symptoms..  Past Medical History  Diagnosis Date  . Ischemic heart disease   . Heart murmur   . Left carotid bruit   . Complete heart block (HCC)   . Pacemaker - St Judes     VDD Lead     Past Surgical History  Procedure Laterality Date  . Abdominal hysterectomy    . Ep implantable device    . Mole removal       Current Outpatient Prescriptions  Medication Sig Dispense Refill  . amLODipine (NORVASC) 10 MG tablet Take 10 mg by mouth daily.     . Ascorbic Acid (VITAMIN C) 500 MG CAPS Take 1 capsule by mouth daily.    Marland Kitchen aspirin 81 MG tablet Take 81 mg by mouth daily.    Marland Kitchen atenolol (TENORMIN) 100 MG tablet TAKE 1 TABLET BY MOUTH EVERY DAY    . Cholecalciferol (VITAMIN D) 400 UNITS capsule Take 1 tablet by mouth daily.     . clopidogrel (PLAVIX) 75 MG tablet Take 75 mg by mouth daily.     Marland Kitchen lisinopril (PRINIVIL,ZESTRIL) 40  MG tablet Take 40 mg by mouth daily.     . Multiple Vitamin (MULTIVITAMIN WITH MINERALS) TABS tablet Take 1 tablet by mouth daily.    . Omega-3 Fatty Acids (FISH OIL) 1000 MG CAPS Take 1 capsule by mouth daily.     . simvastatin (ZOCOR) 20 MG tablet Take 20 mg by mouth daily.      No current facility-administered medications for this visit.    Allergies:   Clonidine derivatives    Social History:  The patient  reports that she quit smoking about 21 years ago. She does not have any smokeless tobacco history on file. She reports that she does not drink alcohol or use illicit drugs.   Family History:  The  patient's family history includes Hypertension in her father and mother.    ROS:  Please see the history of present illness.   Otherwise, review of systems are positive for none.   All other systems are reviewed and negative.    PHYSICAL EXAM: VS:  BP 140/70 mmHg  Pulse 64  Ht  (1.626 m)  Wt 106 lb 6.4 oz (48.263 kg)  BMI 18.25 kg/m2 , BMI Body mass index is 18.25 kg/(m^2). GEN: Well nourished, well developed, in no acute distress HEENT: normal Neck: no JVD, there is a soft carotid bruit on the right and a moderate carotid bruit on the left., or masses Cardiac: RRR; there is a grade 2/6 systolic ejection murmur at the aortic area.  No diastolic murmur.  No, rubs, or gallops,no edema  Respiratory:  clear to auscultation bilaterally, normal work of breathing GI: soft, nontender, nondistended, + BS MS: no deformity or atrophy Skin: warm and dry, no rash Neuro:  Strength and sensation are intact Psych: euthymic mood, full affect   EKG:  EKG is ordered today. The ekg ordered today demonstrates a paced ventricular rhythm.   Recent Labs: 06/06/2014: ALT 20; BUN 22; Creatinine, Ser 1.23*; Hemoglobin 12.7; Platelets 182; Potassium 3.8; Sodium 136    Lipid Panel No results found for: CHOL, TRIG, HDL, CHOLHDL, VLDL, LDLCALC, LDLDIRECT    Wt Readings from Last 3 Encounters:  04/13/15 106 lb 6.4 oz (48.263 kg)  03/03/15 106 lb 1.9 oz (48.136 kg)  10/22/14 101 lb 1.9 oz (45.868 kg)        ASSESSMENT AND PLAN:  1.  1. ischemic heart disease status post acute myocardial infarction with PCI in 1994. No subsequent angina. 2. asymptomatic bilateral carotid bruits 3. murmur of aortic stenosis, asymptomatic 4. history of sick sinus syndrome with functioning dual-chamber pacemaker (St. Jude) implanted in 2000. Patient uncertain when the generator was changed. She has been told that her pacemaker probably has another 2 or 3 years of battery life.   Current medicines are reviewed  at length with the patient today.  The patient does not have concerns regarding medicines.  The following changes have been made:  no change  Labs/ tests ordered today include:   Orders Placed This Encounter  Procedures  . EKG 12-Lead     Disposition:   Continue current medication.  She continues to do very well.  She still lives on her own.  She will return in 6 months for follow-up office visit and EKG with Dr. Elease Hashimoto  Signed, Cassell Clement MD 04/13/2015 5:04 PM    [facility] [address] [place], [place], [place]  [address] Phone: [phone]; Fax: [phone]

## 2015-06-03 ENCOUNTER — Encounter: Payer: Self-pay | Admitting: Internal Medicine

## 2015-06-03 DIAGNOSIS — I442 Atrioventricular block, complete: Secondary | ICD-10-CM | POA: Diagnosis not present

## 2015-08-05 ENCOUNTER — Ambulatory Visit (INDEPENDENT_AMBULATORY_CARE_PROVIDER_SITE_OTHER): Payer: Medicare Other | Admitting: *Deleted

## 2015-08-05 DIAGNOSIS — I442 Atrioventricular block, complete: Secondary | ICD-10-CM

## 2015-08-05 DIAGNOSIS — I495 Sick sinus syndrome: Secondary | ICD-10-CM

## 2015-08-06 LAB — CUP PACEART INCLINIC DEVICE CHECK
Battery Voltage: 2.72 V
Implantable Lead Implant Date: 20000201
Lead Channel Impedance Value: 442 Ohm
Lead Channel Pacing Threshold Amplitude: 1 V
Lead Channel Sensing Intrinsic Amplitude: 0.2 mV
Lead Channel Setting Pacing Pulse Width: 0.5 ms
MDC IDC LEAD LOCATION: 753860
MDC IDC MSMT BATTERY IMPEDANCE: 5200 Ohm
MDC IDC MSMT LEADCHNL RV PACING THRESHOLD PULSEWIDTH: 0.5 ms
MDC IDC SESS DTM: 20170621191237
MDC IDC SET LEADCHNL RV SENSING SENSITIVITY: 4 mV
Pulse Gen Model: 5826
Pulse Gen Serial Number: 1785931

## 2015-08-06 NOTE — Progress Notes (Signed)
Pacemaker check in clinic. Normal device function. Threshold, sensing, and impedance consistent with previous measurements. Device programmed to maximize longevity. 13 AMS episodes--max dur. 26 sec, Max A 192--no EGMs. 9318 "PMT" episodes--no VA conduction noted today. No high ventricular rates noted. Episode triggers for High atrial rate, HVR, and PMT d/c'd today to maximize longevity (ok per SK)--increased battery from 1-1.25 years remaining to 2.25-2.5 years. Device programmed at appropriate safety margins. Histogram distribution appropriate for patient activity level. Device programmed to optimize intrinsic conduction. Patient will follow up with SK in 6 months.

## 2015-09-18 ENCOUNTER — Encounter: Payer: Self-pay | Admitting: Internal Medicine

## 2015-12-08 ENCOUNTER — Encounter: Payer: Self-pay | Admitting: Cardiovascular Disease

## 2015-12-08 DIAGNOSIS — I442 Atrioventricular block, complete: Secondary | ICD-10-CM | POA: Diagnosis not present

## 2015-12-09 ENCOUNTER — Telehealth: Payer: Self-pay | Admitting: Cardiology

## 2015-12-09 NOTE — Telephone Encounter (Signed)
[facility] called and stated that pt is nearing ERI and is faxing the report.

## 2015-12-14 ENCOUNTER — Telehealth: Payer: Self-pay

## 2015-12-14 NOTE — Telephone Encounter (Signed)
Called pt and informed her that her TTM from October inidcated that her battery was nearing ERI, and  to schedule apt with device clinic to check battery on pacemaker. Pt stated that she would have her daughter call back because she taker her to all her appointments. Pt given number to device clinic for her daughter to make appointment.

## 2015-12-18 ENCOUNTER — Encounter: Payer: Medicare Other | Admitting: Cardiovascular Disease

## 2015-12-23 ENCOUNTER — Encounter: Payer: Self-pay | Admitting: Cardiovascular Disease

## 2016-01-05 ENCOUNTER — Encounter: Payer: Self-pay | Admitting: Cardiovascular Disease

## 2016-01-14 ENCOUNTER — Emergency Department (HOSPITAL_COMMUNITY): Payer: Medicare Other

## 2016-01-14 ENCOUNTER — Inpatient Hospital Stay (HOSPITAL_COMMUNITY)
Admission: EM | Admit: 2016-01-14 | Discharge: 2016-01-17 | DRG: 291 | Disposition: A | Discharge status: Skilled Nursing Facility | Payer: Medicare Other | Attending: Internal Medicine | Admitting: Internal Medicine

## 2016-01-14 ENCOUNTER — Encounter (HOSPITAL_COMMUNITY): Payer: Self-pay

## 2016-01-14 DIAGNOSIS — I5021 Acute systolic (congestive) heart failure: Secondary | ICD-10-CM | POA: Diagnosis not present

## 2016-01-14 DIAGNOSIS — I10 Essential (primary) hypertension: Secondary | ICD-10-CM | POA: Diagnosis present

## 2016-01-14 DIAGNOSIS — F0390 Unspecified dementia without behavioral disturbance: Secondary | ICD-10-CM | POA: Diagnosis present

## 2016-01-14 DIAGNOSIS — T502X5A Adverse effect of carbonic-anhydrase inhibitors, benzothiadiazides and other diuretics, initial encounter: Secondary | ICD-10-CM | POA: Diagnosis present

## 2016-01-14 DIAGNOSIS — I5031 Acute diastolic (congestive) heart failure: Secondary | ICD-10-CM | POA: Diagnosis present

## 2016-01-14 DIAGNOSIS — Z7982 Long term (current) use of aspirin: Secondary | ICD-10-CM | POA: Diagnosis not present

## 2016-01-14 DIAGNOSIS — I509 Heart failure, unspecified: Secondary | ICD-10-CM

## 2016-01-14 DIAGNOSIS — Z66 Do not resuscitate: Secondary | ICD-10-CM | POA: Diagnosis present

## 2016-01-14 DIAGNOSIS — E785 Hyperlipidemia, unspecified: Secondary | ICD-10-CM | POA: Diagnosis present

## 2016-01-14 DIAGNOSIS — Z87891 Personal history of nicotine dependence: Secondary | ICD-10-CM | POA: Diagnosis not present

## 2016-01-14 DIAGNOSIS — I255 Ischemic cardiomyopathy: Secondary | ICD-10-CM | POA: Diagnosis present

## 2016-01-14 DIAGNOSIS — I495 Sick sinus syndrome: Secondary | ICD-10-CM | POA: Diagnosis present

## 2016-01-14 DIAGNOSIS — I110 Hypertensive heart disease with heart failure: Principal | ICD-10-CM | POA: Diagnosis present

## 2016-01-14 DIAGNOSIS — Z79899 Other long term (current) drug therapy: Secondary | ICD-10-CM

## 2016-01-14 DIAGNOSIS — I442 Atrioventricular block, complete: Secondary | ICD-10-CM | POA: Diagnosis present

## 2016-01-14 DIAGNOSIS — F03918 Unspecified dementia, unspecified severity, with other behavioral disturbance: Secondary | ICD-10-CM | POA: Diagnosis present

## 2016-01-14 DIAGNOSIS — Z90710 Acquired absence of both cervix and uterus: Secondary | ICD-10-CM

## 2016-01-14 DIAGNOSIS — Z8249 Family history of ischemic heart disease and other diseases of the circulatory system: Secondary | ICD-10-CM | POA: Diagnosis not present

## 2016-01-14 DIAGNOSIS — J9601 Acute respiratory failure with hypoxia: Secondary | ICD-10-CM | POA: Diagnosis not present

## 2016-01-14 DIAGNOSIS — Z9861 Coronary angioplasty status: Secondary | ICD-10-CM

## 2016-01-14 DIAGNOSIS — E876 Hypokalemia: Secondary | ICD-10-CM | POA: Diagnosis present

## 2016-01-14 DIAGNOSIS — F0391 Unspecified dementia with behavioral disturbance: Secondary | ICD-10-CM | POA: Diagnosis present

## 2016-01-14 DIAGNOSIS — F05 Delirium due to known physiological condition: Secondary | ICD-10-CM | POA: Diagnosis present

## 2016-01-14 DIAGNOSIS — I471 Supraventricular tachycardia: Secondary | ICD-10-CM | POA: Diagnosis present

## 2016-01-14 DIAGNOSIS — I252 Old myocardial infarction: Secondary | ICD-10-CM | POA: Diagnosis not present

## 2016-01-14 DIAGNOSIS — N179 Acute kidney failure, unspecified: Secondary | ICD-10-CM | POA: Diagnosis not present

## 2016-01-14 DIAGNOSIS — I4891 Unspecified atrial fibrillation: Secondary | ICD-10-CM | POA: Diagnosis not present

## 2016-01-14 DIAGNOSIS — Z888 Allergy status to other drugs, medicaments and biological substances status: Secondary | ICD-10-CM

## 2016-01-14 DIAGNOSIS — Z950 Presence of cardiac pacemaker: Secondary | ICD-10-CM | POA: Diagnosis present

## 2016-01-14 DIAGNOSIS — I259 Chronic ischemic heart disease, unspecified: Secondary | ICD-10-CM | POA: Diagnosis present

## 2016-01-14 LAB — BASIC METABOLIC PANEL
ANION GAP: 8 (ref 5–15)
BUN: 19 mg/dL (ref 6–20)
CHLORIDE: 105 mmol/L (ref 101–111)
CO2: 24 mmol/L (ref 22–32)
Calcium: 8.8 mg/dL — ABNORMAL LOW (ref 8.9–10.3)
Creatinine, Ser: 0.72 mg/dL (ref 0.44–1.00)
GFR calc Af Amer: >60 mL/min (ref >60)
Glucose, Bld: 120 mg/dL — ABNORMAL HIGH (ref 65–99)
POTASSIUM: 3.3 mmol/L — AB (ref 3.5–5.1)
SODIUM: 137 mmol/L (ref 135–145)

## 2016-01-14 LAB — CBC
HCT: 38.3 % (ref 36.0–46.0)
HEMOGLOBIN: 12.4 g/dL (ref 12.0–15.0)
MCH: 29.4 pg (ref 26.0–34.0)
MCHC: 32.4 g/dL (ref 30.0–36.0)
MCV: 90.8 fL (ref 78.0–100.0)
PLATELETS: 226 10*3/uL (ref 150–400)
RBC: 4.22 MIL/uL (ref 3.87–5.11)
RDW: 15.5 % (ref 11.5–15.5)
WBC: 7.6 10*3/uL (ref 4.0–10.5)

## 2016-01-14 LAB — I-STAT TROPONIN, ED: TROPONIN I, POC: 0.01 ng/mL (ref 0.00–0.08)

## 2016-01-14 LAB — BRAIN NATRIURETIC PEPTIDE: B NATRIURETIC PEPTIDE 5: 580.1 pg/mL — AB (ref 0.0–100.0)

## 2016-01-14 MED ORDER — ASPIRIN 81 MG PO CHEW
81.0000 mg | CHEWABLE_TABLET | Freq: Every day | ORAL | Status: DC
  Administered 2016-01-14 – 2016-01-17 (×4): 81 mg via ORAL
  Filled 2016-01-14 (×4): qty 1

## 2016-01-14 MED ORDER — POTASSIUM CHLORIDE CRYS ER 20 MEQ PO TBCR
20.0000 meq | EXTENDED_RELEASE_TABLET | Freq: Two times a day (BID) | ORAL | Status: DC
  Administered 2016-01-14 – 2016-01-17 (×7): 20 meq via ORAL
  Filled 2016-01-14 (×7): qty 1

## 2016-01-14 MED ORDER — DONEPEZIL HCL 5 MG PO TABS
5.0000 mg | ORAL_TABLET | Freq: Every day | ORAL | Status: DC
  Administered 2016-01-14 – 2016-01-16 (×3): 5 mg via ORAL
  Filled 2016-01-14 (×3): qty 1

## 2016-01-14 MED ORDER — ATENOLOL 50 MG PO TABS
50.0000 mg | ORAL_TABLET | Freq: Two times a day (BID) | ORAL | Status: DC
  Administered 2016-01-14 – 2016-01-17 (×7): 50 mg via ORAL
  Filled 2016-01-14 (×7): qty 1

## 2016-01-14 MED ORDER — CLOPIDOGREL BISULFATE 75 MG PO TABS
75.0000 mg | ORAL_TABLET | Freq: Every day | ORAL | Status: DC
  Administered 2016-01-14 – 2016-01-17 (×4): 75 mg via ORAL
  Filled 2016-01-14 (×4): qty 1

## 2016-01-14 MED ORDER — ONDANSETRON HCL 4 MG/2ML IJ SOLN: 4.0000 mg | Freq: Four times a day (QID) | INTRAMUSCULAR | Status: DC | PRN

## 2016-01-14 MED ORDER — ENOXAPARIN SODIUM 40 MG/0.4ML ~~LOC~~ SOLN
40.0000 mg | SUBCUTANEOUS | Status: DC
  Administered 2016-01-14 – 2016-01-15 (×2): 40 mg via SUBCUTANEOUS
  Filled 2016-01-14 (×2): qty 0.4

## 2016-01-14 MED ORDER — AMLODIPINE BESYLATE 10 MG PO TABS
10.0000 mg | ORAL_TABLET | Freq: Every day | ORAL | Status: DC
Start: 2016-01-14 — End: 2016-01-17
  Administered 2016-01-14 – 2016-01-17 (×4): 10 mg via ORAL
  Filled 2016-01-14 (×4): qty 1

## 2016-01-14 MED ORDER — SODIUM CHLORIDE 0.9 % IV SOLN: 250.0000 mL | INTRAVENOUS | Status: DC | PRN

## 2016-01-14 MED ORDER — SODIUM CHLORIDE 0.9% FLUSH
3.0000 mL | Freq: Two times a day (BID) | INTRAVENOUS | Status: DC
  Administered 2016-01-14 – 2016-01-17 (×6): 3 mL via INTRAVENOUS

## 2016-01-14 MED ORDER — FUROSEMIDE 10 MG/ML IJ SOLN
40.0000 mg | Freq: Once | INTRAMUSCULAR | Status: AC
  Administered 2016-01-14: 40 mg via INTRAVENOUS
  Filled 2016-01-14: qty 4

## 2016-01-14 MED ORDER — SIMVASTATIN 20 MG PO TABS
20.0000 mg | ORAL_TABLET | Freq: Every day | ORAL | Status: DC
Start: 2016-01-14 — End: 2016-01-17
  Administered 2016-01-14 – 2016-01-17 (×4): 20 mg via ORAL
  Filled 2016-01-14 (×4): qty 1

## 2016-01-14 MED ORDER — LISINOPRIL 20 MG PO TABS
40.0000 mg | ORAL_TABLET | Freq: Every day | ORAL | Status: DC
Start: 2016-01-14 — End: 2016-01-17
  Administered 2016-01-14 – 2016-01-17 (×4): 40 mg via ORAL
  Filled 2016-01-14 (×4): qty 2

## 2016-01-14 MED ORDER — FUROSEMIDE 10 MG/ML IJ SOLN
40.0000 mg | Freq: Two times a day (BID) | INTRAMUSCULAR | Status: DC
  Administered 2016-01-14 – 2016-01-16 (×4): 40 mg via INTRAVENOUS
  Filled 2016-01-14 (×4): qty 4

## 2016-01-14 MED ORDER — SODIUM CHLORIDE 0.9% FLUSH: 3.0000 mL | INTRAVENOUS | Status: DC | PRN

## 2016-01-14 MED ORDER — ACETAMINOPHEN 325 MG PO TABS: 650.0000 mg | ORAL_TABLET | ORAL | Status: DC | PRN

## 2016-01-14 NOTE — ED Notes (Signed)
Per EMS- Pt started have SOB at midnight which got progressively worse. Initial sat of 76, 99% on nonrebreather. Coarse crackles throughout lower lobes. Mild lower extremity edema, onset of 4 days ago.

## 2016-01-14 NOTE — ED Notes (Signed)
Pt daughter will like to be updated on changes with pt plan of care.  Ashby DawesKris Berger contact information [phone].

## 2016-01-14 NOTE — ED Provider Notes (Signed)
[facility]-EMERGENCY DEPT Provider Note   CSN: [id number] Arrival date & time: 01/14/16  0549     History   Chief Complaint Chief Complaint  Patient presents with  . Respiratory Distress    HPI Kathy Solomon is a 82 y.o. female.  HPI  82 y.o. female with a hx of Complete Heart Block, Pacemaker- [employer] Jud[employer], presents to the Emergency Department today complaining of shortness of breath tonight. Notes sitting down and watching TV when she had an increasing shortness of breath. Pt states she felt warm. No active chest pain. No N/V. No diaphoresis. Notes edema in BLE x 4 days. Pt states she takes a fluid pill, but none is listed on her medication list and she does not remember the name. She notified EMS and she was found to be 76% on RA. Given non rebreather and sating at 99%. Pt comfortable now. Pt with hx of ischemic heart disease. MI in 1994 with PCI in [place]. Hx Sick Sinus Syndrome with [employer] Jude dual-chamber Pacemaker placed in 2001. No cough. No fever. No other symptoms noted.  Past Medical History:  Diagnosis Date  . Complete heart block (HCC)   . Heart murmur   . Ischemic heart disease   . Left carotid bruit   . Pacemaker - [employer]es    VDD Lead     Patient Active Problem List   Diagnosis Date Noted  . Ischemic heart disease 10/16/2013  . Pacemaker 10/16/2013  . Heart murmur, aortic 10/16/2013  . Left carotid bruit 10/16/2013  . Aortic valve defect 10/16/2013  . Chronic ischemic heart disease 10/16/2013  . Cardiovascular symptoms 10/16/2013  . Decreased pulse 10/16/2013  . 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 Arteriosclerosis of coronary artery 11/30/2010  . DD (diverticular disease) 11/30/2010  . HLD (hyperlipidemia) 11/30/2010  . BP (high blood pressure) 11/30/2010  . Block, bundle branch, left 11/30/2010  . OP (osteoporosis) 11/30/2010  . H/O cataract extraction 11/30/2010    Past Surgical History:  Procedure Laterality Date  . ABDOMINAL  HYSTERECTOMY    . EP IMPLANTABLE DEVICE    . MOLE REMOVAL      OB History    No data available       Home Medications    Prior to Admission medications   Medication Sig Start Date End Date Taking? Authorizing Provider  amLODipine (NORVASC) 10 MG tablet Take 10 mg by mouth daily.     Historical Provider, MD  Ascorbic Acid (VITAMIN C) 500 MG CAPS Take 1 capsule by mouth daily.    Historical Provider, MD  aspirin 81 MG tablet Take 81 mg by mouth daily.    Historical Provider, MD  atenolol (TENORMIN) 100 MG tablet Take 50 mg by mouth 2 (two) times daily. TAKE 1 TABLET BY MOUTH EVERY DAY 05/21/13   Historical Provider, MD  Cholecalciferol (VITAMIN D) 400 UNITS capsule Take 1 tablet by mouth daily. Reported on 08/05/2015    Historical Provider, MD  clopidogrel (PLAVIX) 75 MG tablet Take 75 mg by mouth daily.     Historical Provider, MD  lisinopril (PRINIVIL,ZESTRIL) 40 MG tablet Take 40 mg by mouth daily.     Historical Provider, MD  Multiple Vitamin (MULTIVITAMIN WITH MINERALS) TABS tablet Take 1 tablet by mouth daily. Reported on 08/05/2015    Historical Provider, MD  Omega-3 Fatty Acids (FISH OIL) 1000 MG CAPS Take 1 capsule by mouth daily.     Historical Provider, MD  simvastatin (ZOCOR) 20 MG tablet Take 20 mg  by mouth daily.     Historical Provider, MD    Family History Family History  Problem Relation Age of Onset  . Hypertension Mother   . Hypertension Father     Social History Social History  Substance Use Topics  . Smoking status: Former Smoker    Quit date: 05/16/1993  . Smokeless tobacco: Never Used  . Alcohol use No     Allergies   Clonidine derivatives   Review of Systems Review of Systems ROS reviewed and all are negative for acute change except as noted in the HPI.  Physical Exam Updated Vital Signs BP (!) 143/111   Pulse 70   Temp 97.5 F (36.4 C) (Oral)   Resp 20   SpO2 97%   Physical Exam  Constitutional: She is oriented to person, place, and time.  Vital signs are normal. She appears well-developed and well-nourished.  HENT:  Head: Normocephalic.  Right Ear: Hearing normal.  Left Ear: Hearing normal.  Eyes: Conjunctivae and EOM are normal. Pupils are equal, round, and reactive to light.  Neck: Normal range of motion. Neck supple.  Cardiovascular: Normal rate, regular rhythm, normal heart sounds and intact distal pulses.   Pulmonary/Chest: Effort normal. She has rales in the right lower field and the left lower field.  Abdominal: Soft.  Musculoskeletal: Normal range of motion.  2+ BLE pitting edema below knee.   Neurological: She is alert and oriented to person, place, and time.  Skin: Skin is warm and dry.  Psychiatric: She has a normal mood and affect. Her speech is normal and behavior is normal. Thought content normal.  Nursing note and vitals reviewed.  ED Treatments / Results  Labs (all labs ordered are listed, but only abnormal results are displayed) Labs Reviewed  BASIC METABOLIC PANEL - Abnormal; Notable for the following:       Result Value   Potassium 3.3 (*)    Glucose, Bld 120 (*)    Calcium 8.8 (*)    All other components within normal limits  BRAIN NATRIURETIC PEPTIDE - Abnormal; Notable for the following:    B Natriuretic Peptide 580.1 (*)    All other components within normal limits  CBC  I-STAT TROPOININ, ED   EKG  EKG Interpretation  Date/Time:  Thursday January 14 2016 06:37:37 EST Ventricular Rate:  91 PR Interval:    QRS Duration: 157 QT Interval:  479 QTC Calculation: 590 R Axis:   -83 Text Interpretation:  Afib/flutter and ventricular-paced rhythm No further analysis attempted due to paced rhythm Confirmed by DELO  MD, DOUGLAS ([id number]) on 01/14/2016 6:47:52 AM      Radiology Dg Chest 2 View  Result Date: 01/14/2016 CLINICAL DATA:  Acute onset of worsening shortness of breath. Decreased O2 saturation. Initial encounter. EXAM: CHEST  2 VIEW COMPARISON:  None. FINDINGS: The lungs are  hyperexpanded, with flattening of the hemidiaphragms, compatible with COPD. Bibasilar airspace opacities are noted. Vascular congestion is noted. Increased interstitial markings raise concern for pulmonary edema. Small bilateral pleural effusions are seen. The heart is borderline normal in size. No acute osseous abnormalities are identified. A pacemaker is noted at the right chest wall, with a lead ending at the right ventricle. IMPRESSION: 1. Bibasilar airspace opacities noted. Vascular congestion. Increased interstitial markings raise concern for pulmonary edema. Small bilateral pleural effusions seen. 2. Findings of COPD. Electronically Signed   By: Roanna Raider M.D.   On: 01/14/2016 06:55    Procedures Procedures (including critical care time)  Medications  Ordered in ED Medications - No data to display   Initial Impression / Assessment and Plan / ED Course  I have reviewed the triage vital signs and the nursing notes.  Pertinent labs & imaging results that were available during my care of the patient were reviewed by me and considered in my medical decision making (see chart for details).  Clinical Course     Final Clinical Impressions(s) / ED Diagnoses  {I have reviewed and evaluated the relevant laboratory values. {I have reviewed and evaluated the relevant imaging studies. {I have interpreted the relevant EKG. {I have reviewed the relevant previous healthcare records. {I have reviewed EMS Documentation. {I obtained HPI from historian. {Patient discussed with supervising physician.  ED Course:  Assessment: Pt is a 88yF with hx Complete Heart Block, Pacemaker- [employer] Jud[employer] who presents with shortness of breath this AM. No active CP. No N/V. No Diaphoresis. 76% on RA on EMS arrival. Given NRB at O2 Sat 99%. On exam, pt in NAD. Nontoxic/nonseptic appearing. VSS. Afebrile. Lungs crackles noted. Heart RRR. Abdomen nontender soft. Given 40mg  IV Lasix. CBC/BMP unremarkable. iStat Trop negative.  BNP 580. CXR shows vascular congestion. Discussed with attending physician. Consult Cardiology. Plan is to Admit to medicine.   Disposition/Plan:  Admit Pt acknowledges and agrees with plan  Supervising Physician Geoffery Lyonsouglas Delo, MD  Final diagnoses:  Acute on chronic congestive heart failure, unspecified congestive heart failure type [facility](HCC)    New Prescriptions New Prescriptions   No medications on file     Audry Piliyler Tanai Bouler, PA-C 01/14/16 0820    Geoffery Lyonsouglas Delo, MD 01/14/16 [id number]

## 2016-01-14 NOTE — Progress Notes (Signed)
  Device [facility] account managerJude representative. Per his report, she has not had any episodes of atrial fibrillation or atrial flutter. Does have occasional episodes of Sinus Tachycardia and PAT. Is mostly in VDD with a Wenckebach rhythm.   No indication for full-dose anticoagulation at this time. Will continue to diurese. May require further adjustment of BB dosing pending HR response.   Signed, Ellsworth LennoxBrittany M Strader, PA-C 01/14/2016, 3:17 PM Pager: [phone]

## 2016-01-14 NOTE — H&P (Addendum)
History and Physical    Kathy Solomon Solomon WUJ:[id number] DOB: Sep 18, 1927 DOA: 01/14/2016  PCP: Kathy DoppHOWELL, TAMIEKA, MD  Patient coming from: Home  Chief Complaint: Dyspnea  HPI: Kathy Solomon is a 82 y.o. female with medical history significant of complete heart block, sick sinus syndrome, MI s/p PCI, hyperlipidemia. Patient reports having a 5 day history of bilateral leg swelling. She reports dyspnea, but states that this is common with her history of allergies. However, last night, she woke up from her sleep because she could not breathe. After she awoke, her breathing continued to worsen and she called EMS for transport to the emergency department. She was started on nonrebreather for O2 sats of 76% on room air. She reports not having any issues with lower extremity swelling in the past. She reports no known history of heart failure. She is followed by cardiology.  ED Course: Vitals: Afebrile. Slightly tachypneic. O2% upper 90s on nonrebreather. Labs: Potassium of 3.3. BNP of 580.1. Imaging: Chest x-ray showed pulmonary vascular congestion with increased interstitial markings concerning for pulmonary edema Medications/Course: Lasix 40 mg IV  Review of Systems: Review of Systems  Respiratory: Positive for shortness of breath. Negative for cough, hemoptysis and sputum production.   Cardiovascular: Positive for orthopnea, leg swelling and PND. Negative for chest pain and palpitations.  Gastrointestinal: Negative for abdominal pain, blood in stool, constipation, diarrhea, melena, nausea and vomiting.  All other systems reviewed and are negative.   Past Medical History:  Diagnosis Date  . Complete heart block (HCC)   . Heart murmur   . Ischemic heart disease   . Left carotid bruit   . Pacemaker - [employer] Judes    VDD Lead     Past Surgical History:  Procedure Laterality Date  . ABDOMINAL HYSTERECTOMY    . EP IMPLANTABLE DEVICE    . MOLE REMOVAL       reports that she quit smoking about  22 years ago. She has never used smokeless tobacco. She reports that she does not drink alcohol or use drugs.  Allergies  Allergen Reactions  . Clonidine Derivatives Nausea Only and Anxiety     "feel like a zombie"    Family History  Problem Relation Age of Onset  . Hypertension Mother   . Hypertension Father     Prior to Admission medications   Medication Sig Start Date End Date Taking? Authorizing Provider  amLODipine (NORVASC) 10 MG tablet Take 10 mg by mouth daily.    Yes Historical Provider, MD  aspirin 81 MG tablet Take 81 mg by mouth daily.   Yes Historical Provider, MD  atenolol (TENORMIN) 100 MG tablet Take 50 mg by mouth 2 (two) times daily.  05/21/13  Yes Historical Provider, MD  clopidogrel (PLAVIX) 75 MG tablet Take 75 mg by mouth daily.    Yes Historical Provider, MD  lisinopril (PRINIVIL,ZESTRIL) 40 MG tablet Take 40 mg by mouth daily.    Yes Historical Provider, MD  simvastatin (ZOCOR) 20 MG tablet Take 20 mg by mouth daily.    Yes Historical Provider, MD    Physical Exam: Vitals:   01/14/16 0603 01/14/16 0603 01/14/16 0701 01/14/16 0830  BP: (!) 143/111  136/67 135/59  Pulse: 70  79 78  Resp: 20  25 22   Temp: 97.5 F (36.4 C)     TempSrc: Oral     SpO2: (!) 74% 97% 97% 92%     Constitutional: NAD, calm, comfortable Eyes: PERRL, lids and conjunctivae normal ENMT: Mucous membranes are  moist. Posterior pharynx clear of any exudate or lesions.  Neck: normal, supple, no masses, no thyromegaly Respiratory: Bilateral rales at bases with diminished breath sounds. Normal respiratory effort. No accessory muscle use.  Cardiovascular: Regular rate and rhythm, no murmurs / rubs / gallops. Bilateral 2+ lower extremity edema. 2+ pedal pulses. Bilateral carotid bruits.  Abdomen: no tenderness, no masses palpated. No hepatosplenomegaly. Bowel sounds positive.  Musculoskeletal: no clubbing / cyanosis. No joint deformity upper and lower extremities. Good ROM, no contractures.  Normal muscle tone.  Skin: Multiple seborrheic keratoses Neurologic: CN 2-12 grossly intact. Sensation intact, DTR normal. Strength 5/5 in all 4.  Psychiatric: Normal judgment and insight. Alert and oriented x 3. Normal mood.    Labs on Admission: I have personally reviewed following labs and imaging studies  CBC:  Recent Labs Lab 01/14/16 0611  WBC 7.6  HGB 12.4  HCT 38.3  MCV 90.8  PLT 226   Basic Metabolic Panel:  Recent Labs Lab 01/14/16 0611  NA 137  K 3.3*  CL 105  CO2 24  GLUCOSE 120*  BUN 19  CREATININE 0.72  CALCIUM 8.8*   GFR: CrCl cannot be calculated (Unknown ideal weight.). Liver Function Tests: No results for input(s): AST, ALT, ALKPHOS, BILITOT, PROT, ALBUMIN in the last 168 hours. No results for input(s): LIPASE, AMYLASE in the last 168 hours. No results for input(s): AMMONIA in the last 168 hours. Coagulation Profile: No results for input(s): INR, PROTIME in the last 168 hours. Cardiac Enzymes: No results for input(s): CKTOTAL, CKMB, CKMBINDEX, TROPONINI in the last 168 hours. BNP (last 3 results) No results for input(s): PROBNP in the last 8760 hours. HbA1C: No results for input(s): HGBA1C in the last 72 hours. CBG: No results for input(s): GLUCAP in the last 168 hours. Lipid Profile: No results for input(s): CHOL, HDL, LDLCALC, TRIG, CHOLHDL, LDLDIRECT in the last 72 hours. Thyroid Function Tests: No results for input(s): TSH, T4TOTAL, FREET4, T3FREE, THYROIDAB in the last 72 hours. Anemia Panel: No results for input(s): VITAMINB12, FOLATE, FERRITIN, TIBC, IRON, RETICCTPCT in the last 72 hours. Urine analysis: No results found for: COLORURINE, APPEARANCEUR, LABSPEC, PHURINE, GLUCOSEU, HGBUR, BILIRUBINUR, KETONESUR, PROTEINUR, UROBILINOGEN, NITRITE, LEUKOCYTESUR Sepsis Labs: !!!!!!!!!!!!!!!!!!!!!!!!!!!!!!!!!!!!!!!!!!!! @LABRCNTIP (procalcitonin:4,lacticidven:4) )No results found for this or any previous visit (from the past 240 hour(s)).    Radiological Exams on Admission: Dg Chest 2 View  Result Date: 01/14/2016 CLINICAL DATA:  Acute onset of worsening shortness of breath. Decreased O2 saturation. Initial encounter. EXAM: CHEST  2 VIEW COMPARISON:  None. FINDINGS: The lungs are hyperexpanded, with flattening of the hemidiaphragms, compatible with COPD. Bibasilar airspace opacities are noted. Vascular congestion is noted. Increased interstitial markings raise concern for pulmonary edema. Small bilateral pleural effusions are seen. The heart is borderline normal in size. No acute osseous abnormalities are identified. A pacemaker is noted at the right chest wall, with a lead ending at the right ventricle. IMPRESSION: 1. Bibasilar airspace opacities noted. Vascular congestion. Increased interstitial markings raise concern for pulmonary edema. Small bilateral pleural effusions seen. 2. Findings of COPD. Electronically Signed   By: Roanna Raider M.D.   On: 01/14/2016 06:55    EKG: Independently reviewed. Paced  Assessment/Plan Principal Problem:   Acute CHF (congestive heart failure) (HCC) Active Problems:   Ischemic heart disease   Pacemaker   HLD (hyperlipidemia)   Sick sinus syndrome (HCC)   Acute heart failure exacerbation Acute hypoxic respiratory failure Orthopnea, dyspnea, elevated BNP and hypervolemia. Improvement with diuresis. Looking back at previous weights,  patient appears to be at baseline on admission. Possibly exacerbated by new onset atrial fibrillation. -cardiology recommendations -continue lasix 40mg  IV BID -potassium 20meq BID -weights -in/outs -continue home lisinopril and atenolol -wean down oxygen as tolerated to keep O2 >92%  Atrial fibrillation EKG suggest a-fib. Rate controlled. Patient's CHA2DS2-VASc Score is 5. -cardiology recommendations -continue atenolol  Ischemic heart disease Patient is s/p acute MI in 1994 with PCI. -continue aspirin -continue plavix  Complete heart block Sick  sinus syndrome Pacemaker ([employer]. Jude) implanted in 2000.   Essential hypertension -continue lisinopril, atenolol and amlodipine  Hyperlipidemia -continue simvastatin  Hypokalemia -replete  DVT prophylaxis: Lovenox Code Status: DNR Family Communication: None at bedside Disposition Plan: Anticipate discharge home in 2 days Consults called: Cardiology by EDP Admission status: Inpatient, telemetry   Jacquelin Hawkingalph Lashawna Poche, MD [facility] Pager [phone]  If 7PM-7AM, please contact night-coverage [email or web address] Password [email or web address]  01/14/2016, 9:10 AM

## 2016-01-14 NOTE — ED Notes (Signed)
Bed: WA16 Expected date:  Expected time:  Means of arrival:  Comments: RES A 

## 2016-01-14 NOTE — Consult Note (Signed)
Cardiology Consult    Patient ID: Kathy Solomon MRN: [id number], DOB/AGE: 04-05-1927   Admit date: 01/14/2016 Date of Consult: 01/14/2016  Primary Physician: Devra Dopp, MD Reason for Consult: CHF Primary Cardiologist: Previously Dr. Patty Sermons --> now Dr. Elease Hashimoto Primary Electrophysiologist: Dr. Graciela Husbands Requesting Provider: Dr. Caleb Popp   History of Present Illness    Kathy Solomon is a 82 y.o. female with past medical history of ischemic cardiomyopathy (s/p PCI in 1994 while in [place]), SSS (s/p St. Jude PPM placement in 2000), mild AS, and known bilateral carotid bruits (1-39% ICA stenosis bilaterally by doppler studies in 10/2014) who presents to [facility] ED on 01/14/2016 for evaluation of dyspnea.   History is obtained from the patient and her daughter Kathy Solomon) at the bedside.   According to the patient, she had been in her usual state of health until midnight. She awoke and was unable to catch her breath. She walked around thinking this might help but her dyspnea worsened. This continued until approximately 0600 when she called EMS.   In retrospect, the patient says she has experienced lower extremity for the past 2 weeks. Has also noticed lightheadedness and palpitations at times when performing activities. Denies any associated chest discomfort in the past few weeks or earlier this morning. No syncope or falls.   Her daughter reports she has experienced visual disturbances and is currently being evaluated by Neurology in the outpatient setting.   Upon EMS arrival, her O2 saturations were initially at 76%, improving to 99% on NRB. Initial labs show a WBC of 7.6, Hgb 12.4, and platelets 226. BMET with K+ 3.3, creatinine 0.72. Initial troponin negative. BNP mildly elevated to 580. CXR shows bibasilar airspace opacities noted along with vascular congestion and increased interstitial markings raising concern for pulmonary edema. Initial EKG read as atrial fibrillation w/  V-paced rhythm, HR 91.  She remains on a NRB at this time with O2 saturations in the mid-90's. Denies any chest pain or palpitations.   Past Medical History   Past Medical History:  Diagnosis Date  . Complete heart block (HCC)   . Heart murmur   . Ischemic heart disease   . Left carotid bruit   . Pacemaker - St Judes    VDD Lead     Past Surgical History:  Procedure Laterality Date  . ABDOMINAL HYSTERECTOMY    . EP IMPLANTABLE DEVICE    . MOLE REMOVAL       Allergies  Allergies  Allergen Reactions  . Clonidine Derivatives Nausea Only and Anxiety     "feel like a zombie"   Outpatient meds: Current Meds  Medication Sig  . amLODipine (NORVASC) 10 MG tablet Take 10 mg by mouth daily.   Marland Kitchen aspirin 81 MG tablet Take 81 mg by mouth daily.  Marland Kitchen atenolol (TENORMIN) 100 MG tablet Take 50 mg by mouth 2 (two) times daily.   . clopidogrel (PLAVIX) 75 MG tablet Take 75 mg by mouth daily.   Marland Kitchen lisinopril (PRINIVIL,ZESTRIL) 40 MG tablet Take 40 mg by mouth daily.   . simvastatin (ZOCOR) 20 MG tablet Take 20 mg by mouth daily.     Inpatient Medications   .   Family History    Family History  Problem Relation Age of Onset  . Hypertension Mother   . Hypertension Father     Social History    Social History   Social History  . Marital status: Widowed    Spouse name: N/A  . Number of  children: N/A  . Years of education: N/A   Occupational History  . Not on file.   Social History Main Topics  . Smoking status: Former Smoker    Quit date: 05/16/1993  . Smokeless tobacco: Never Used  . Alcohol use No  . Drug use: No  . Sexual activity: Not on file   Other Topics Concern  . Not on file   Social History Narrative  . No narrative on file     Review of Systems    General:  No chills, fever, night sweats or weight changes.  Cardiovascular:  No chest pain, palpitations, paroxysmal nocturnal dyspnea. Positive for orthopnea, dyspnea on exertion, and edema.    Dermatological: No rash, lesions/masses Respiratory: No cough, Positive for dyspnea. Urologic: No hematuria, dysuria Abdominal:   No nausea, vomiting, diarrhea, bright red blood per rectum, melena, or hematemesis Neurologic:  No visual changes, wkns, changes in mental status. All other systems reviewed and are otherwise negative except as noted above.  Physical Exam    Blood pressure 135/59, pulse 78, temperature 97.5 F (36.4 C), temperature source Oral, resp. rate 22, SpO2 92 %.  General: Pleasant, elderly Caucasian female appearing in NAD. Currently on NRB. Psych: Normal affect. Neuro: Alert and oriented X 3. Moves all extremities spontaneously. HEENT: Bilateral carotid bruits.  Neck: Supple without bruits or JVD. Lungs:  Resp regular and unlabored, mild rales at bases bilaterally. Currently on NRB. Heart: Irregularly irregular. no s3 or s4, 2/6 SEM at RUSB. Abdomen: Soft, non-tender, non-distended, BS + x 4.  Extremities: No clubbing or cyanosis. 1+ pitting edema bilaterally up to mid-shins. DP/PT/Radials 2+ and equal bilaterally.  Labs    Troponin [facility](Point of Care Test)  Recent Labs  01/14/16 0623  TROPIPOC 0.01   No results for input(s): CKTOTAL, CKMB, TROPONINI in the last 72 hours. Lab Results  Component Value Date   WBC 7.6 01/14/2016   HGB 12.4 01/14/2016   HCT 38.3 01/14/2016   MCV 90.8 01/14/2016   PLT 226 01/14/2016    Recent Labs Lab 01/14/16 0611  NA 137  K 3.3*  CL 105  CO2 24  BUN 19  CREATININE 0.72  CALCIUM 8.8*  GLUCOSE 120*   No results found for: CHOL, HDL, LDLCALC, TRIG No results found for: [facility]   Radiology Studies    Dg Chest 2 View  Result Date: 01/14/2016 CLINICAL DATA:  Acute onset of worsening shortness of breath. Decreased O2 saturation. Initial encounter. EXAM: CHEST  2 VIEW COMPARISON:  None. FINDINGS: The lungs are hyperexpanded, with flattening of the hemidiaphragms, compatible with COPD. Bibasilar airspace opacities are  noted. Vascular congestion is noted. Increased interstitial markings raise concern for pulmonary edema. Small bilateral pleural effusions are seen. The heart is borderline normal in size. No acute osseous abnormalities are identified. A pacemaker is noted at the right chest wall, with a lead ending at the right ventricle. IMPRESSION: 1. Bibasilar airspace opacities noted. Vascular congestion. Increased interstitial markings raise concern for pulmonary edema. Small bilateral pleural effusions seen. 2. Findings of COPD. Electronically Signed   By: Roanna RaiderJeffery  Chang M.D.   On: 01/14/2016 06:55    EKG & Cardiac Imaging    EKG: Atrial fibrillation w/ V-paced rhythm, HR 91.  Echocardiogram: None on File  Assessment & Plan    1. Acute Hypoxic Respiratory Failure/ CHF Exacerbation - awoke at midnight with severe dyspnea. Experienced orthopnea and dyspnea with exertion throughout the rest of the night. Reports worsening lower extremity edema for  the past [redacted] weeks along with episodes of lightheadedness and palpitations. Denies any recent chest discomfort.  - hypoxic at 76% upon EMS arrival, improving to 99% on NRB. Initial troponin negative. BNP 580. CXR shows bibasilar airspace opacities noted along with vascular congestion and increased interstitial markings raising concern for pulmonary edema. - obtain echocardiogram to further define type of CHF. Check D-dimer. - continue diuresis with IV Lasix 40mg  BID. Obtain daily weights and strict I&O's. Repeat BMET in AM.   2. New Onset Atrial Fibrillation - repeat EKG and telemetry are concerning for atrial fibrillation. Will need to have her St.Jude device interrogated. K+ 3.3 (being replaced). Check Mg and TSH.  - This patients CHA2DS2-VASc Score and unadjusted Ischemic Stroke Rate (% per year) is equal to 9.7 % stroke rate/year from a score of 6 (CHF, HTN, Vascular, Female, Age (2)). Discussed the need for anticoagulation with the patient and her daughter if her  EKG is in fact most consistent with atrial fibrillation. Would consider initiation of Eliquis 2.5mg  BID (reduced dosing with age > 80 and body weight < 60kg) but will review with MD prior to starting this.  - continue BB for rate control.    3. Ischemic Cardiomyopathy - s/p PCI in 1994 while in [place]. No echo on file for review. - will repeat echocardiogram to assess LV function and wall motion. - continue ASA, Plavix, BB, and statin therapy. Will need to stop Plavix if she is initiated on anticoagulation.   4. SSS - s/p St. Jude PPM placement in 2000. Nearing ERI. - followed closely by device clinic.   5. Mild AS - reported history of this by review of Dr. Yevonne PaxBrackbill's note. Murmur appreciated on exam. - obtain repeat echocardiogram for further evaluation.    Signed, Ellsworth LennoxBrittany M Strader, PA-C 01/14/2016, 9:17 AM Pager: [phone]  I have personally seen and examined this patient with Randall AnBrittany Strader, PA-C. I agree with the assessment and plan as outlined above. She is admitted with acute volume overload/respiratory distress. Report of ischemic cardiomyopathy but no echo on file here in [place]. Also with irregular heart rhythm which appears to be atrial fib. My exam shows a thin, frail elderly female with NRB mask. XB:[id number]:irregular. Lungs: basilar crackles. Ext: 1+ bilateral LE edema.  Labs reviewed. Renal function is ok. Troponin is negative. BNP is elevated. EKG reviewed by me and shows atrial fib with pacing.  She appears to be have acute CHF, unsure if this is diastolic or systolic. Echo is pending.  Will diurese with IV Lasix.  Cycle troponin Will have her pacer interrogated. LIkely that this is atrial fib. If confirmed, will need to be started on Eliquis Continue atenolol for now.  We will follow with you.   Verne CarrowChristopher McAlhany 01/14/2016 11:48 AM

## 2016-01-14 NOTE — ED Notes (Signed)
Bed: RESA Expected date:  Expected time:  Means of arrival:  Comments: Pulmonary edema, ventricular paced

## 2016-01-15 ENCOUNTER — Inpatient Hospital Stay (HOSPITAL_COMMUNITY): Payer: Medicare Other

## 2016-01-15 DIAGNOSIS — I509 Heart failure, unspecified: Secondary | ICD-10-CM

## 2016-01-15 DIAGNOSIS — I255 Ischemic cardiomyopathy: Secondary | ICD-10-CM

## 2016-01-15 LAB — BASIC METABOLIC PANEL
Anion gap: 12 (ref 5–15)
BUN: 18 mg/dL (ref 6–20)
CO2: 30 mmol/L (ref 22–32)
CREATININE: 0.82 mg/dL (ref 0.44–1.00)
Calcium: 8.7 mg/dL — ABNORMAL LOW (ref 8.9–10.3)
Chloride: 99 mmol/L — ABNORMAL LOW (ref 101–111)
GFR calc Af Amer: >60 mL/min (ref >60)
GLUCOSE: 97 mg/dL (ref 65–99)
Potassium: 2.8 mmol/L — ABNORMAL LOW (ref 3.5–5.1)
SODIUM: 141 mmol/L (ref 135–145)

## 2016-01-15 LAB — ECHOCARDIOGRAM COMPLETE
Height: 65 in
WEIGHTICAEL: 1604.95 [oz_av]

## 2016-01-15 LAB — MAGNESIUM: MAGNESIUM: 1.7 mg/dL (ref 1.7–2.4)

## 2016-01-15 LAB — D-DIMER, QUANTITATIVE: D-Dimer, Quant: 1.63 ug/mL-FEU — ABNORMAL HIGH (ref 0.00–0.50)

## 2016-01-15 LAB — TSH: TSH: 3.126 u[IU]/mL (ref 0.350–4.500)

## 2016-01-15 MED ORDER — ENSURE ENLIVE PO LIQD
237.0000 mL | Freq: Two times a day (BID) | ORAL | Status: DC
  Administered 2016-01-15 – 2016-01-17 (×4): 237 mL via ORAL

## 2016-01-15 MED ORDER — POTASSIUM CHLORIDE CRYS ER 20 MEQ PO TBCR
30.0000 meq | EXTENDED_RELEASE_TABLET | ORAL | Status: AC
  Administered 2016-01-15 (×2): 30 meq via ORAL
  Filled 2016-01-15 (×2): qty 1

## 2016-01-15 MED ORDER — MAGNESIUM SULFATE 2 GM/50ML IV SOLN
2.0000 g | Freq: Once | INTRAVENOUS | Status: AC
Start: 2016-01-15 — End: 2016-01-15
  Administered 2016-01-15: 2 g via INTRAVENOUS
  Filled 2016-01-15: qty 50

## 2016-01-15 NOTE — Evaluation (Addendum)
Physical Therapy Evaluation Patient Details Name: Kathy Solomon MRN: [id number] DOB: April 15, 1927 Today's Date: 01/15/2016    Patient Saturations on 3 Liters at Rest = 93%  Patient Saturations on 3 Liters of oxygen while Ambulating = 85%    History of Present Illness  82 yo female admitted with CHF. Hx of SSS, pacemaker, osteoporosis.   Clinical Impression  On eval, pt required Min assist for mobility. She walked ~60 feet with hallway handrail. Pt is unsteady and at risk for falls. She will likely require a RW for safe ambulation-continuing to assess. Per chart, and pt, she lives alone. At this time, I do not think pt can safely d/c back to her home alone. Recommend ST rehab at SNF unless family can provide 24 hour supervision.     Follow Up Recommendations SNF;Supervision/Assistance - 24 hour (unless pt can stay with family or have family stay with her)    Equipment Recommendations  Rolling walker with 5" wheels (continuing to assess)    Recommendations for Other Services       Precautions / Restrictions Precautions Precautions: Fall Precaution Comments: monitor O2 sats Restrictions Weight Bearing Restrictions: No      Mobility  Bed Mobility Overal bed mobility: Needs Assistance Bed Mobility: Supine to Sit;Sit to Supine     Supine to sit: Supervision;HOB elevated Sit to supine: Supervision;HOB elevated   General bed mobility comments: for safety  Transfers Overall transfer level: Needs assistance Equipment used: 1 person hand held assist Transfers: Sit to/from Stand Sit to Stand: Min assist         General transfer comment: Assist to rise, stabilize, control descent.   Ambulation/Gait Ambulation/Gait assistance: Min assist Ambulation Distance (Feet): 60 Feet Assistive device:  (hallway handrail) Gait Pattern/deviations: Step-through pattern;Decreased stride length     General Gait Details: Pt required external support of handrail and stabilization from  therapist. Slow gait speed. O2 sats 85% 3L O2 during ambulation. She tolerated distance well.   Stairs            Wheelchair Mobility    Modified Rankin (Stroke Patients Only)       Balance Overall balance assessment: Needs assistance           Standing balance-Leahy Scale: Poor                               Pertinent Vitals/Pain Pain Assessment: No/denies pain    Home Living Family/patient expects to be discharged to:: Private residence Living Arrangements: Alone Available Help at Discharge: Family Type of Home: House       Home Layout: One level Home Equipment: Gilmer Morane - single point Additional Comments: unsure of accuracy of info    Prior Function Level of Independence: Independent               Hand Dominance        Extremity/Trunk Assessment   Upper Extremity Assessment: Defer to OT evaluation           Lower Extremity Assessment: Generalized weakness      Cervical / Trunk Assessment: Kyphotic  Communication   Communication: No difficulties  Cognition Arousal/Alertness: Awake/alert Behavior During Therapy: WFL for tasks assessed/performed Overall Cognitive Status: No family/caregiver present to determine baseline cognitive functioning                      General Comments      Exercises  Assessment/Plan    PT Assessment Patient needs continued PT services  PT Problem List Decreased strength;Decreased mobility;Decreased activity tolerance;Decreased balance;Decreased cognition;Decreased knowledge of use of DME          PT Treatment Interventions DME instruction;Gait training;Therapeutic activities;Therapeutic exercise;Patient/family education;Functional mobility training;Balance training    PT Goals (Current goals can be found in the Care Plan section)  Acute Rehab PT Goals Patient Stated Goal: none stated PT Goal Formulation: With patient Time For Goal Achievement: 01/29/16 Potential to Achieve  Goals: Good    Frequency Min 3X/week   Barriers to discharge        Co-evaluation               End of Session Equipment Utilized During Treatment: Gait belt Activity Tolerance: Patient tolerated treatment well Patient left: in bed;with call bell/phone within reach;with bed alarm set           Time: 1340-1400 PT Time Calculation (min) (ACUTE ONLY): 20 min   Charges:   PT Evaluation $PT Eval Low Complexity: 1 Procedure     PT G Codes:        Rebeca AlertJannie Leonell Lobdell, MPT Pager: [phone]

## 2016-01-15 NOTE — Progress Notes (Signed)
Initial Nutrition Assessment  DOCUMENTATION CODES:   Underweight  INTERVENTION:   Provide Ensure Enlive po BID, each supplement provides 350 kcal and 20 grams of protein RD will address protein supplements at follow-up.  NUTRITION DIAGNOSIS:   Increased nutrient needs related to other (see comment) (CHF) as evidenced by estimated needs.  GOAL:   Patient will meet greater than or equal to 90% of their needs  MONITOR:   PO intake, Labs, Weight trends, I & O's  REASON FOR ASSESSMENT:   Consult Assessment of nutrition requirement/status  ASSESSMENT:   82 y.o. female with medical history significant of complete heart block, sick sinus syndrome, MI s/p PCI, hyperlipidemia. Patient reports having a 5 day history of bilateral leg swelling. She reports dyspnea, but states that this is common with her history of allergies. However, last night, she woke up from her sleep because she could not breathe. After she awoke, her breathing continued to worsen and she called EMS for transport to the emergency department.   Pt in room on the telephone. Nutrition service ambassador in room waiting to speak with patient once on the phone. Per [employer], pt has eaten most of her lunch tray of fish, mashed potatoes, fruit and yogurt. Pt is underweight and based on visual assessment most likely presents with some degree of malnutrition. Unable to perform NFPE at this time. Will attempt at follow-up. Would benefit from nutritional supplements, will order and assess acceptance at follow-up. Per chart review, pt has lost 6 lb since 2/27 (6% wt loss x 9 months, insignificant for time frame).   Medications: IV Lasix BID, Mg sulfate once, K-DUR BID Labs reviewed: Low K Mg WNL  Diet Order:  Diet Heart Room service appropriate? Yes; Fluid consistency: Thin  Skin:  Reviewed, no issues  Last BM:  PTA  Height:   Ht Readings from Last 1 Encounters:  01/14/16 5\' 5"  (1.651 m)    Weight:   Wt Readings  from Last 1 Encounters:  01/15/16 100 lb 5 oz (45.5 kg)    Ideal Body Weight:  56.8 kg  BMI:  Body mass index is 16.69 kg/m.  Estimated Nutritional Needs:   Kcal:  1200-1400  Protein:  55-65g  Fluid:  1.4L/day  EDUCATION NEEDS:   No education needs identified at this time  Tilda FrancoLindsey Carmel Waddington, MS, RD, LDN Pager: [phone] After Hours Pager: [phone]

## 2016-01-15 NOTE — Evaluation (Signed)
Occupational Therapy Evaluation Patient Details Name: Kathy Solomon MRN: [id number] DOB: [birth year] Today's Date: 01/15/2016    History of Present Illness 82 yo female admitted with CHF. Hx of SSS, pacemaker, osteoporosis.    Clinical Impression   Pt admitted with above. She demonstrates the below listed deficits and will benefit from continued OT to maximize safety and independence with BADLs.  Pt presents to OT with generalized weakness, impaired balance, decreased activity tolerance, and impaired cognition.  She requires min A for ADLs.  Recommend SNF.       Follow Up Recommendations  SNF;Supervision/Assistance - 24 hour    Equipment Recommendations  None recommended by OT    Recommendations for Other Services       Precautions / Restrictions Precautions Precautions: Fall Precaution Comments: monitor O2 sats Restrictions Weight Bearing Restrictions: No      Mobility Bed Mobility Overal bed mobility: Needs Assistance Bed Mobility: Supine to Sit;Sit to Supine     Supine to sit: Supervision Sit to supine: Supervision   General bed mobility comments: for safety  Transfers Overall transfer level: Needs assistance Equipment used: 1 person hand held assist Transfers: Sit to/from Stand;Stand Pivot Transfers Sit to Stand: Min assist Stand pivot transfers: Min assist       General transfer comment: assist for balance     Balance Overall balance assessment: Needs assistance Sitting-balance support: Feet supported Sitting balance-Leahy Scale: Good     Standing balance support: During functional activity;No upper extremity supported Standing balance-Leahy Scale: Fair                              ADL Overall ADL's : Needs assistance/impaired Eating/Feeding: Independent   Grooming: Wash/dry hands;Wash/dry face;Oral care;Brushing hair;Minimal assistance;Standing   Upper Body Bathing: Set up;Sitting   Lower Body Bathing: Minimal assistance;Sit  to/from stand   Upper Body Dressing : Set up;Sitting   Lower Body Dressing: Minimal assistance;Sit to/from stand   Toilet Transfer: Minimal assistance;Ambulation;Comfort height toilet;Regular Toilet;Grab bars;RW   Toileting- Clothing Manipulation and Hygiene: Minimal assistance;Sit to/from stand       Functional mobility during ADLs: Minimal assistance;Rolling walker General ADL Comments: min A for balance.  02 sats on RA 88%.  On 3L supplemental 02, 94%      Vision     Perception     Praxis      Pertinent Vitals/Pain Pain Assessment: No/denies pain     Hand Dominance Right   Extremity/Trunk Assessment Upper Extremity Assessment Upper Extremity Assessment: Generalized weakness   Lower Extremity Assessment Lower Extremity Assessment: Defer to PT evaluation   Cervical / Trunk Assessment Cervical / Trunk Assessment: Kyphotic   Communication Communication Communication: No difficulties   Cognition Arousal/Alertness: Awake/alert Behavior During Therapy: WFL for tasks assessed/performed Overall Cognitive Status: No family/caregiver present to determine baseline cognitive functioning                     General Comments       Exercises       Shoulder Instructions      Home Living Family/patient expects to be discharged to:: Private residence Living Arrangements: Alone Available Help at Discharge: Family Type of Home: House       Home Layout: One level               Home Equipment: Gilmer MorCane - single point   Additional Comments: Pt indicates daughter is looking for a new place for  her to live       Prior Functioning/Environment Level of Independence: Independent        Comments: Pt reports that she has stopped driving         OT Problem List: Decreased strength;Decreased activity tolerance;Impaired balance (sitting and/or standing);Decreased cognition;Decreased safety awareness;Decreased knowledge of use of DME or AE   OT  Treatment/Interventions: Self-care/ADL training;Therapeutic exercise;Energy conservation;DME and/or AE instruction;Therapeutic activities;Cognitive remediation/compensation;Patient/family education;Balance training    OT Goals(Current goals can be found in the care plan section) Acute Rehab OT Goals Patient Stated Goal: to get stronger  OT Goal Formulation: With patient Time For Goal Achievement: 01/29/16 Potential to Achieve Goals: Good ADL Goals Pt Will Perform Grooming: with supervision;standing Pt Will Perform Lower Body Bathing: with supervision;sit to/from stand Pt Will Perform Lower Body Dressing: with supervision;sit to/from stand Pt Will Transfer to Toilet: with supervision;ambulating;regular height toilet;bedside commode;grab bars Pt Will Perform Toileting - Clothing Manipulation and hygiene: with supervision;sit to/from stand  OT Frequency: Min 2X/week   Barriers to D/C: Decreased caregiver support          Co-evaluation              End of Session Equipment Utilized During Treatment: Oxygen Nurse Communication: Mobility status  Activity Tolerance: Patient tolerated treatment well Patient left: in bed;with call bell/phone within reach;with bed alarm set   Time: [id number] OT Time Calculation (min): 23 min Charges:  OT General Charges $OT Visit: 1 Procedure OT Evaluation $OT Eval Moderate Complexity: 1 Procedure OT Treatments $Therapeutic Activity: 8-22 mins G-Codes:    Deni Lefever M 01/15/2016, 5:06 PM

## 2016-01-15 NOTE — Progress Notes (Signed)
Pt is active with [facility] [phone]) with HHRN/PT/SW. Pt will continue with WC, referral given to in house rep.

## 2016-01-15 NOTE — Progress Notes (Signed)
Patient Name: Marijean BravoMarion Yanda Date of Encounter: 01/15/2016  Primary Cardiologist: Previously Dr. Patty SermonsBrackbill --> now Dr. Elease HashimotoNahser Primary Electrophysiologist: Dr. Beverly SessionsKlein  Hospital Problem List     Principal Problem:   Acute CHF (congestive heart failure) [facility](HCC) Active Problems:   Ischemic heart disease   Pacemaker   HLD (hyperlipidemia)   Essential hypertension   Sick sinus syndrome (HCC)   Acute respiratory failure with hypoxia (HCC)   Complete heart block (HCC)   Subjective   Breathing significantly improved. Reports feeling better since admission. No chest discomfort or palpitations. Requesting a bath this morning.   Inpatient Medications    Scheduled Meds: . amLODipine  10 mg Oral Daily  . aspirin  81 mg Oral Daily  . atenolol  50 mg Oral BID  . clopidogrel  75 mg Oral Daily  . donepezil  5 mg Oral QHS  . enoxaparin (LOVENOX) injection  40 mg Subcutaneous Q24H  . furosemide  40 mg Intravenous BID  . lisinopril  40 mg Oral Daily  . potassium chloride  30 mEq Oral Q4H  . potassium chloride  20 mEq Oral BID  . simvastatin  20 mg Oral Daily  . sodium chloride flush  3 mL Intravenous Q12H   Continuous Infusions:  PRN Meds: sodium chloride, acetaminophen, ondansetron (ZOFRAN) IV, sodium chloride flush   Vital Signs    Vitals:   01/15/16 0459 01/15/16 0600 01/15/16 0622 01/15/16 0646  BP:   (!) 146/52   Pulse:   65   Resp:   16   Temp:   98.7 F (37.1 C)   TempSrc:   Oral   SpO2: 93%  90% 93%  Weight:  100 lb 5 oz (45.5 kg)    Height:        Intake/Output Summary (Last 24 hours) at 01/15/16 16100952 Last data filed at 01/15/16 0700  Gross per 24 hour  Intake              420 ml  Output             2800 ml  Net            -2380 ml   Filed Weights   01/14/16 1616 01/15/16 0600  Weight: 102 lb 12.8 oz (46.6 kg) 100 lb 5 oz (45.5 kg)    Physical Exam   GEN: Pleasant elderly Caucasian female appearing in no acute distress.  HEENT: Grossly normal.  Neck:  Supple, no JVD, carotid bruits, or masses. Cardiac: RRR, 2/6 SEM at RUSB, rubs, or gallops. No clubbing, cyanosis, edema.  Radials/DP/PT 2+ and equal bilaterally.  Respiratory:  Respirations regular and unlabored, mild rales at bases bilaterally. GI: Soft, nontender, nondistended, BS + x 4. MS: no deformity or atrophy. Skin: warm and dry, no rash. Neuro:  Strength and sensation are intact. Psych: AAOx3.  Normal affect.  Labs    CBC  Recent Labs  01/14/16 0611  WBC 7.6  HGB 12.4  HCT 38.3  MCV 90.8  PLT 226   Basic Metabolic Panel  Recent Labs  01/14/16 0611 01/15/16 0446  NA 137 141  K 3.3* 2.8*  CL 105 99*  CO2 24 30  GLUCOSE 120* 97  BUN 19 18  CREATININE 0.72 0.82  CALCIUM 8.8* 8.7*  MG  --  1.7   Liver Function Tests No results for input(s): AST, ALT, ALKPHOS, BILITOT, PROT, ALBUMIN in the last 72 hours. No results for input(s): LIPASE, AMYLASE in the last 72 hours. Cardiac  Enzymes No results for input(s): CKTOTAL, CKMB, CKMBINDEX, TROPONINI in the last 72 hours. BNP Invalid input(s): POCBNP D-Dimer  Recent Labs  01/15/16 0446  DDIMER 1.63*   Hemoglobin A1C No results for input(s): HGBA1C in the last 72 hours. Fasting Lipid Panel No results for input(s): CHOL, HDL, LDLCALC, TRIG, CHOLHDL, LDLDIRECT in the last 72 hours. Thyroid Function Tests  Recent Labs  01/15/16 0446  TSH 3.126    Telemetry    V-paced, HR in 70's.  - Personally Reviewed  ECG    No new tracings.   Radiology    Dg Chest 2 View  Result Date: 01/14/2016 CLINICAL DATA:  Acute onset of worsening shortness of breath. Decreased O2 saturation. Initial encounter. EXAM: CHEST  2 VIEW COMPARISON:  None. FINDINGS: The lungs are hyperexpanded, with flattening of the hemidiaphragms, compatible with COPD. Bibasilar airspace opacities are noted. Vascular congestion is noted. Increased interstitial markings raise concern for pulmonary edema. Small bilateral pleural effusions are seen.  The heart is borderline normal in size. No acute osseous abnormalities are identified. A pacemaker is noted at the right chest wall, with a lead ending at the right ventricle. IMPRESSION: 1. Bibasilar airspace opacities noted. Vascular congestion. Increased interstitial markings raise concern for pulmonary edema. Small bilateral pleural effusions seen. 2. Findings of COPD. Electronically Signed   By: Roanna Raider M.D.   On: 01/14/2016 06:55    Cardiac Studies   Echocardiogram: Pending  Patient Profile     82 y.o. female w/ PMH of ischemic cardiomyopathy (s/p PCI in 1994 while in [place]), SSS (s/p St. Jude PPM placement in 2000), mild AS, and known bilateral carotid bruits (1-39% ICA stenosis bilaterally by doppler studies in 10/2014) who presented to [facility] ED on 01/14/2016 for evaluation of dyspnea. BNP at 580. Initial EKG concerning for atrial fibrillation but most consistent with PAT by device interrogation.   Assessment & Plan    1. Acute Hypoxic Respiratory Failure/ CHF Exacerbation - awoke at midnight with severe dyspnea. Experienced orthopnea and dyspnea with exertion throughout the rest of the night. Reports worsening lower extremity edema for the past [redacted] weeks along with episodes of lightheadedness and palpitations. Denies any recent chest discomfort.  - hypoxic at 76% upon EMS arrival, requiring NRB and currently on 3L [place]. BNP 580. CXR shows bibasilar airspace opacities noted along with vascular congestion and increased interstitial markings raising concern for pulmonary edema. Echocardiogram pending to assess LV function.  - started on IV Lasix 40mg  BID with a net output of -2.3L thus far. Weight down 2 lbs. Continue with IV Lasix today with plans to transition to PO tomorrow. Wean down O2 as tolerated.   2. Paroxysmal Atrial Tachycardia - EKG on telemetry and initial telemetry strips were concerning for atrial fibrillation, however device interrogation showed this most  most consistent with sinus tachycardia and episodes of PAT. No evidence of atrial fibrillation or atrial flutter by device interrogation report.  - continue Atenolol 50mg  BID for rate control.    3. Ischemic Cardiomyopathy - s/p PCI in 1994 while in [place]. No echo on file for review. Will repeat echocardiogram to assess LV function and wall motion. - continue ASA, Plavix, BB, and statin therapy.   4. SSS - s/p St. Jude PPM placement in 2000. Nearing ERI. - followed closely by device clinic.   5. Mild AS - reported history of this by review of Dr. Yevonne Pax note. Murmur appreciated on exam. - repeat echocardiogram to be obtained today for further  evaluation.   6. D-dimer - elevated to 1.63 on admission. Consider CTA for rule out pf PE with her presentation of acute dyspnea.  - per admitting team  7. Hypokalemia - K+ 2.8 this AM. Being replaced.   Signed, Ellsworth LennoxBrittany M Strader, PA  01/15/2016, 9:52 AM   I have personally seen and examined this patient with Randall AnBrittany Strader, PA-C. I agree with the assessment and plan as outlined above. She is diuresing well. Still mildly volume overloaded. Continue IV lasix. D-dimer elevated. Consider imaging to exclude PE. See pacer interrogation as above. No need for changes in settings. This is not atrial fib. No indication for anti-coagulation. Echo pending today.   Verne CarrowChristopher Diogo Anne 01/15/2016 12:12 PM

## 2016-01-15 NOTE — Progress Notes (Signed)
PROGRESS NOTE                                                                                                                                                                                                             Patient Demographics:    Kathy BravoMarion Solomon, is a 82 y.o. female, DOB - 1927/08/18, ZOX:[id number]  Admit date - 01/14/2016   Admitting Physician Narda Bondsalph A Nettey, MD  Outpatient Primary MD for the patient is Devra DoppHOWELL, TAMIEKA, MD  LOS - 1  Outpatient Specialists: Cardiology  Chief Complaint  Patient presents with  . Respiratory Distress       Brief Narrative   82 year old female with complete heart block with sick sinus syndrome status post St. Jude's PPM, ischemic cardiomyopathy  status post PCI IN 1994 in [place], [facility] for acute onset of shortness of breath. Also had increasing leg swellings for the past 2 weeks. Patient found to be no acute hypoxic respiratory failure by EMS (O2 sat of 76%) required NRB and brought to the ED. Chest x-ray showed bibasilar airspace opacities with vascular congestion. BNP of 580. Patient found to have acute congestive heart failure.   Subjective:    Reports her breathing to be much better.   Assessment  & Plan :    Principal Problem:   Acute CHF (congestive heart failure) (HCC) On IV Lasix 40 mg twice a day with good urine output. Appreciate cardiology evaluation. Recommend to continue diuresis. 2-D echo pending. Continue aspirin, Plavix, beta blocker and statin.   Active Problems: Paroxysmal atrial tachycardia Initial concern for A. fib but on device interrogation showed sinus tachycardia and episodes of PAT. Adjust Beta blocker for better rate control.   history of ischemic cardiomyopathy Continue aspirin, Plavix, statin and beta blocker.    Sick sinus syndrome [facility](HCC) Has pacemaker which was interrogated on  admission.  hypokalemia/hypomagnesemia Replenished  Weakness and unsteady gait PT evaluation.   Code Status : DO NOT RESUSCITATE   Family Communication  :none at bedside   disposition: Home once improved, possibly in the next 48 hours  Consults  :   Cardiology   Procedures  :  2-D echo Pacemaker interrogation   DVT prophylaxis  :  Lovenox -   Lab Results  Component Value Date   PLT 226 01/14/2016    Antibiotics  :  Anti-infectives    None        Objective:   Vitals:   01/15/16 0459 01/15/16 0600 01/15/16 0622 01/15/16 0646  BP:   (!) 146/52   Pulse:   65   Resp:   16   Temp:   98.7 F (37.1 C)   TempSrc:   Oral   SpO2: 93%  90% 93%  Weight:  45.5 kg (100 lb 5 oz)    Height:        Wt Readings from Last 3 Encounters:  01/15/16 45.5 kg (100 lb 5 oz)  04/13/15 48.3 kg (106 lb 6.4 oz)  03/03/15 48.1 kg (106 lb 1.9 oz)     Intake/Output Summary (Last 24 hours) at 01/15/16 1217 Last data filed at 01/15/16 1010  Gross per 24 hour  Intake              663 ml  Output             2800 ml  Net            -2137 ml     Physical Exam  Gen: not in distress HEENT:  moist mucosa, supple neck Chest: clear b/l, no added sounds CVS: N S1&S2, no murmurs, rubs or gallop GI: soft, NT, ND, BS+ Musculoskeletal: warm, trace edema     Data Review:    CBC  Recent Labs Lab 01/14/16 0611  WBC 7.6  HGB 12.4  HCT 38.3  PLT 226  MCV 90.8  MCH 29.4  MCHC 32.4  RDW 15.5    Chemistries   Recent Labs Lab 01/14/16 0611 01/15/16 0446  NA 137 141  K 3.3* 2.8*  CL 105 99*  CO2 24 30  GLUCOSE 120* 97  BUN 19 18  CREATININE 0.72 0.82  CALCIUM 8.8* 8.7*  MG  --  1.7   ------------------------------------------------------------------------------------------------------------------ No results for input(s): CHOL, HDL, LDLCALC, TRIG, CHOLHDL, LDLDIRECT in the last 72 hours.  No results found for:  HGBA1C ------------------------------------------------------------------------------------------------------------------  Recent Labs  01/15/16 0446  TSH 3.126   ------------------------------------------------------------------------------------------------------------------ No results for input(s): VITAMINB12, FOLATE, FERRITIN, TIBC, IRON, RETICCTPCT in the last 72 hours.  Coagulation profile No results for input(s): INR, PROTIME in the last 168 hours.   Recent Labs  01/15/16 0446  DDIMER 1.63*    Cardiac Enzymes No results for input(s): CKMB, TROPONINI, MYOGLOBIN in the last 168 hours.  Invalid input(s): CK ------------------------------------------------------------------------------------------------------------------    Component Value Date/Time   BNP 580.1 (H) 01/14/2016 16100611    Inpatient Medications  Scheduled Meds: . amLODipine  10 mg Oral Daily  . aspirin  81 mg Oral Daily  . atenolol  50 mg Oral BID  . clopidogrel  75 mg Oral Daily  . donepezil  5 mg Oral QHS  . enoxaparin (LOVENOX) injection  40 mg Subcutaneous Q24H  . furosemide  40 mg Intravenous BID  . lisinopril  40 mg Oral Daily  . potassium chloride  30 mEq Oral Q4H  . potassium chloride  20 mEq Oral BID  . simvastatin  20 mg Oral Daily  . sodium chloride flush  3 mL Intravenous Q12H   Continuous Infusions: PRN Meds:.sodium chloride, acetaminophen, ondansetron (ZOFRAN) IV, sodium chloride flush  Micro Results No results found for this or any previous visit (from the past 240 hour(s)).  Radiology Reports Dg Chest 2 View  Result Date: 01/14/2016 CLINICAL DATA:  Acute onset of worsening shortness of breath. Decreased O2 saturation. Initial encounter. EXAM: CHEST  2  VIEW COMPARISON:  None. FINDINGS: The lungs are hyperexpanded, with flattening of the hemidiaphragms, compatible with COPD. Bibasilar airspace opacities are noted. Vascular congestion is noted. Increased interstitial markings raise  concern for pulmonary edema. Small bilateral pleural effusions are seen. The heart is borderline normal in size. No acute osseous abnormalities are identified. A pacemaker is noted at the right chest wall, with a lead ending at the right ventricle. IMPRESSION: 1. Bibasilar airspace opacities noted. Vascular congestion. Increased interstitial markings raise concern for pulmonary edema. Small bilateral pleural effusions seen. 2. Findings of COPD. Electronically Signed   By: Roanna Raider M.D.   On: 01/14/2016 06:55    Time Spent in minutes  25   Eddie North M.D on 01/15/2016 at 12:17 PM  Between 7am to 7pm - Pager - [phone]  After 7pm go to [email or web address] - password [facility]  [facility] -  Office  [phone]

## 2016-01-16 DIAGNOSIS — I509 Heart failure, unspecified: Secondary | ICD-10-CM

## 2016-01-16 DIAGNOSIS — I5031 Acute diastolic (congestive) heart failure: Secondary | ICD-10-CM

## 2016-01-16 LAB — BASIC METABOLIC PANEL
Anion gap: 8 (ref 5–15)
BUN: 28 mg/dL — AB (ref 6–20)
CO2: 32 mmol/L (ref 22–32)
CREATININE: 1.1 mg/dL — AB (ref 0.44–1.00)
Calcium: 8.8 mg/dL — ABNORMAL LOW (ref 8.9–10.3)
Chloride: 98 mmol/L — ABNORMAL LOW (ref 101–111)
GFR calc Af Amer: 51 mL/min — ABNORMAL LOW (ref >60)
GFR, EST NON AFRICAN AMERICAN: 44 mL/min — AB (ref >60)
Glucose, Bld: 98 mg/dL (ref 65–99)
Potassium: 3.9 mmol/L (ref 3.5–5.1)
SODIUM: 138 mmol/L (ref 135–145)

## 2016-01-16 MED ORDER — FUROSEMIDE 40 MG PO TABS
40.0000 mg | ORAL_TABLET | Freq: Every day | ORAL | Status: DC
  Administered 2016-01-16 – 2016-01-17 (×2): 40 mg via ORAL
  Filled 2016-01-16 (×2): qty 1

## 2016-01-16 MED ORDER — ENOXAPARIN SODIUM 30 MG/0.3ML ~~LOC~~ SOLN
30.0000 mg | SUBCUTANEOUS | Status: DC
  Administered 2016-01-16: 30 mg via SUBCUTANEOUS
  Filled 2016-01-16: qty 0.3

## 2016-01-16 NOTE — Progress Notes (Signed)
PROGRESS NOTE                                                                                                                                                                                                             Patient Demographics:    Kathy Solomon, is a 82 y.o. female, DOB - April 27, 1927, ZOX:[id number]  Admit date - 01/14/2016   Admitting Physician Narda Bonds, MD  Outpatient Primary MD for the patient is Devra Dopp, MD  LOS - 2  Outpatient Specialists: Cardiology  Chief Complaint  Patient presents with  . Respiratory Distress       Brief Narrative   82 year old female with complete heart block with sick sinus syndrome status post St. Jude's PPM, ischemic cardiomyopathy  status post PCI IN 1994 in [place], presented to [facility] hospital for acute onset of shortness of breath. Also had increasing leg swellings for the past 2 weeks. Patient found to be no acute hypoxic respiratory failure by EMS (O2 sat of 76%) required NRB and brought to the ED. Chest x-ray showed bibasilar airspace opacities with vascular congestion. BNP of 580. Patient found to have acute diastolic congestive heart failure.   Subjective:   breathing to be much better.   Assessment  & Plan :    Principal Problem:   Acute CHF (congestive heart failure) (HCC) diuresed well. Appreciate cardiology evaluation. switched to po lasix this am.  2-D echo with normal EF, moderate MR. Continue aspirin, Plavix, beta blocker and statin.   Active Problems: Paroxysmal atrial tachycardia Device interrogated. Adjust Beta blocker for better rate control.   history of ischemic cardiomyopathy Continue aspirin, Plavix, statin and beta blocker.   Sick sinus syndrome [facility]) Has pacemaker which was interrogated on admission.  hypokalemia/hypomagnesemia Replenished  Weakness and unsteady gait PT evaluation recommended  SNF.   Dementia Mild. Daughter reports sundowning at home. continue namenda.    Code Status : DO NOT RESUSCITATE   Family Communication  :spoke with daughter on the phine   disposition: SNF once bed available  Consults  :   Cardiology   Procedures  :  2-D echo Pacemaker interrogation   DVT prophylaxis  :  Lovenox -   Lab Results  Component Value Date   PLT 226 01/14/2016    Antibiotics  :   Anti-infectives    None  Objective:   Vitals:   01/15/16 1357 01/15/16 2213 01/16/16 0608 01/16/16 1300  BP: 121/85 124/77 (!) 141/61 121/69  Pulse: 68 64 77 68  Resp: 18 18 18 18   Temp: 98.1 F (36.7 C) 97.6 F (36.4 C) 98 F (36.7 C) 98.2 F (36.8 C)  TempSrc: Oral Oral Oral Oral  SpO2: 93% 90% 95% 93%  Weight:   45.7 kg (100 lb 12 oz)   Height:        Wt Readings from Last 3 Encounters:  01/16/16 45.7 kg (100 lb 12 oz)  04/13/15 48.3 kg (106 lb 6.4 oz)  03/03/15 48.1 kg (106 lb 1.9 oz)     Intake/Output Summary (Last 24 hours) at 01/16/16 1447 Last data filed at 01/16/16 0900  Gross per 24 hour  Intake              900 ml  Output             1625 ml  Net             -725 ml     Physical Exam  Gen: not in distress HEENT:  moist mucosa, supple neck Chest: clear b/l, no added sounds CVS: N S1&S2, no murmurs,  GI: soft, NT, ND Musculoskeletal: warm, trace edema ( improved)     Data Review:    CBC  Recent Labs Lab 01/14/16 0611  WBC 7.6  HGB 12.4  HCT 38.3  PLT 226  MCV 90.8  MCH 29.4  MCHC 32.4  RDW 15.5    Chemistries   Recent Labs Lab 01/14/16 0611 01/15/16 0446 01/16/16 0534  NA 137 141 138  K 3.3* 2.8* 3.9  CL 105 99* 98*  CO2 24 30 32  GLUCOSE 120* 97 98  BUN 19 18 28*  CREATININE 0.72 0.82 1.10*  CALCIUM 8.8* 8.7* 8.8*  MG  --  1.7  --    ------------------------------------------------------------------------------------------------------------------ No results for input(s): CHOL, HDL, LDLCALC, TRIG,  CHOLHDL, LDLDIRECT in the last 72 hours.  No results found for: HGBA1C ------------------------------------------------------------------------------------------------------------------  Recent Labs  01/15/16 0446  TSH 3.126   ------------------------------------------------------------------------------------------------------------------ No results for input(s): VITAMINB12, FOLATE, FERRITIN, TIBC, IRON, RETICCTPCT in the last 72 hours.  Coagulation profile No results for input(s): INR, PROTIME in the last 168 hours.   Recent Labs  01/15/16 0446  DDIMER 1.63*    Cardiac Enzymes No results for input(s): CKMB, TROPONINI, MYOGLOBIN in the last 168 hours.  Invalid input(s): CK ------------------------------------------------------------------------------------------------------------------    Component Value Date/Time   BNP 580.1 (H) 01/14/2016 [id number]    Inpatient Medications  Scheduled Meds: . amLODipine  10 mg Oral Daily  . aspirin  81 mg Oral Daily  . atenolol  50 mg Oral BID  . clopidogrel  75 mg Oral Daily  . donepezil  5 mg Oral QHS  . enoxaparin (LOVENOX) injection  40 mg Subcutaneous Q24H  . feeding supplement (ENSURE ENLIVE)  237 mL Oral BID BM  . furosemide  40 mg Oral Daily  . lisinopril  40 mg Oral Daily  . potassium chloride  20 mEq Oral BID  . simvastatin  20 mg Oral Daily  . sodium chloride flush  3 mL Intravenous Q12H   Continuous Infusions: PRN Meds:.sodium chloride, acetaminophen, ondansetron (ZOFRAN) IV, sodium chloride flush  Micro Results No results found for this or any previous visit (from the past 240 hour(s)).  Radiology Reports Dg Chest 2 View  Result Date: 01/14/2016 CLINICAL DATA:  Acute onset of  worsening shortness of breath. Decreased O2 saturation. Initial encounter. EXAM: CHEST  2 VIEW COMPARISON:  None. FINDINGS: The lungs are hyperexpanded, with flattening of the hemidiaphragms, compatible with COPD. Bibasilar airspace opacities  are noted. Vascular congestion is noted. Increased interstitial markings raise concern for pulmonary edema. Small bilateral pleural effusions are seen. The heart is borderline normal in size. No acute osseous abnormalities are identified. A pacemaker is noted at the right chest wall, with a lead ending at the right ventricle. IMPRESSION: 1. Bibasilar airspace opacities noted. Vascular congestion. Increased interstitial markings raise concern for pulmonary edema. Small bilateral pleural effusions seen. 2. Findings of COPD. Electronically Signed   By: Roanna RaiderJeffery  Chang M.D.   On: 01/14/2016 06:55    Time Spent in minutes  25   Eddie NorthHUNGEL, Elverta Dimiceli M.D on 01/16/2016 at 2:47 PM  Between 7am to 7pm - Pager - [phone]  After 7pm go to [email or web address] - password [email or web address]  [facility] -  Office  [phone]

## 2016-01-16 NOTE — Clinical Social Work Note (Signed)
Clinical Social Work Assessment  Patient Details  Name: Kathy Solomon MRN: [id number] Date of Birth: 1927-05-13  Date of referral:  01/16/16               Reason for consult:  Facility Placement                Permission sought to share information with:  Family Supports, Chartered certified accountant granted to share information::  Yes, Verbal Permission Granted  Name::     Kathy Solomon, Daughter      Housing/Transportation Living arrangements for the past 2 months:  [place] (Lived alone in town house) Source of Information:  Patient, Adult Children Patient Interpreter Needed:  None Criminal Activity/Legal Involvement Pertinent to Current Situation/Hospitalization:  No - Comment as needed Significant Relationships:  Adult Children Lives with:  Self Do you feel safe going back to the place where you live?  Yes (Daughter has concerns; patient does not (confused at times)) Need for family participation in patient care:  Yes (Comment) (Pt wants daughter involved)  Care giving concerns:  Lives home alone in a townhouse. Daughter reports forgetfulness and anxiety at home. Not managing self care.  Social Worker assessment / plan:  SW met with patient, daughter Kathy Solomon, Son-in-law and Dr. Clementeen Graham this evening for full discussion of SNF placement process and home care options.  Patient was involved in discussion however- her thought were, at times, noted to be somewhat confusing.  Daughter feels that her mother is not safe to return home alone and she has been bringing her to her home frequently which seems to cause more anxiety and confusion.  PT recommends SNF placement and patient/daughter agree to short term SNF.  SNF list provided and discussed SNF placement process. Daughter will research facilities online tonight.  Per MD- patient is stable to be placed.  Daughter is leaning towards [facility] if they will offer.  PASARR number obtained.  Employment status:   Retired Forensic scientist:  [employer] PT Recommendations:  [place] / Referral to community resources:  [place]  Patient/Family's Response to care: Patient and daughter are pleased with care being provided; daughter stated that since her mother was hospitalized- she's been able to rest at home for the first time in a long time.  Patient/Family's Understanding of and Emotional Response to Diagnosis, Current Treatment, and Prognosis:  Patient noted to be happy and pleasant; she is agreeable to SNF placement but only seems to have a limited understanding of placement. Her greatest concern was if she could have a private room.  Daughter is knowledgeable and involved in her mother's care.   Emotional Assessment Appearance:  Appears stated age Attitude/Demeanor/Rapport:   (appropriate to age and situation) Affect (typically observed):   (appropriate to age and situation) Orientation:  Oriented to Self, Fluctuating Orientation (Suspected and/or reported Sundowners) Alcohol / Substance use:  Tobacco Use (smoked 20 years ago) Psych involvement (Current and /or in the community):  No (Comment)  Discharge Needs  Concerns to be addressed:  Care Coordination (unsafe to live alone per dgt's concerns) Readmission within the last 30 days:  No Current discharge risk:  Lives alone Barriers to Discharge:  No Barriers Identified   Estill Bakes 01/16/2016, 5:55 PM

## 2016-01-16 NOTE — Clinical Social Work Placement (Signed)
   CLINICAL SOCIAL WORK PLACEMENT  NOTE  Date:  01/16/2016  Patient Details  Name: Marijean BravoMarion Bramer MRN: [id number] Date of Birth: Oct 29, 1927  Clinical Social Work is seeking post-discharge placement for this patient at the Skilled  Nursing Facility level of care (*CSW will initial, date and re-position this form in  chart as items are completed):  Yes   Patient/family provided with [place] Clinical Social Work Department's list of facilities offering this level of care within the geographic area requested by the patient (or if unable, by the patient's family).  Yes   Patient/family informed of their freedom to choose among providers that offer the needed level of care, that participate in Medicare, Medicaid or managed care program needed by the patient, have an available bed and are willing to accept the patient.  Yes   Patient/family informed of [place]'s ownership interest in [facility] and [facility], as well as of the fact that they are under no obligation to receive care at these facilities.  PASRR submitted to EDS on 01/16/16     PASRR number received on 01/16/16     Existing PASRR number confirmed on       FL2 transmitted to all facilities in geographic area requested by pt/family on 01/16/16     FL2 transmitted to all facilities within larger geographic area on       Patient informed that his/her managed care company has contracts with or will negotiate with certain facilities, including the following:            Patient/family informed of bed offers received.  Patient chooses bed at       Physician recommends and patient chooses bed at      Patient to be transferred to   on  .  Patient to be transferred to facility by       Patient family notified on   of transfer.  Name of family member notified:        PHYSICIAN Please prepare priority discharge summary, including medications, Please sign DNR, Please prepare prescriptions, Please sign FL2      Additional Comment:    _______________________________________________ Darylene Pricerowder, Aleksis Jiggetts T, LCSW 01/16/2016, 9:54 PM

## 2016-01-16 NOTE — Progress Notes (Signed)
   Subjective: Breath[place] is better  No CP   Objective: Vitals:   01/15/16 1215 01/15/16 1357 01/15/16 2213 01/16/16 0608  BP:  121/85 124/77 (!) 141/61  Pulse:  68 64 77  Resp:  18 18 18   Temp:  98.1 F (36.7 C) 97.6 F (36.4 C) 98 F (36.7 C)  TempSrc:  Oral Oral Oral  SpO2: (!) 88% 93% 90% 95%  Weight:    100 lb 12 oz (45.7 kg)  Height:       Weight change: -2 lb 0.8 oz (-0.93 kg)  Intake/Output Summary (Last 24 hours) at 01/16/16 0942 Last data filed at 01/16/16 0900  Gross per 24 hour  Intake             1253 ml  Output             2025 ml  Net             -772 ml   I/O  -2.9 L  Tele:  SR  Paced  General: Thin   Alert, awake, oriented x3, in no acute distress Neck:  JVP is normal Heart: Regular rate and rhythm, without murmurs, rubs, gallops.  Lungs: Clear anteriorly to auscultation.  No rales or wheezes. Exemities:  No edema.   Neuro: Grossly intact, nonfocal.   Lab Results: Results for orders placed or performed during the hospital encounter of 01/14/16 (from the past 24 hour(s))  Basic metabolic panel     Status: Abnormal   Collection Time: 01/16/16  5:34 AM  Result Value Ref Range   Sodium 138 135 - 145 mmol/L   Potassium 3.9 3.5 - 5.1 mmol/L   Chloride 98 (L) 101 - 111 mmol/L   CO2 32 22 - 32 mmol/L   Glucose, Bld 98 65 - 99 mg/dL   BUN 28 (H) 6 - 20 mg/dL   Creatinine, Ser 2.951.10 (H) 0.44 - 1.00 mg/dL   Calcium 8.8 (L) 8.9 - 10.3 mg/dL   GFR calc non Af Amer 44 (L) >60 mL/min   GFR calc Af Amer 51 (L) >60 mL/min   Anion gap 8 5 - 15    Studies/Results: No results found.  Medications:Reviewed    @PROBHOSP @  1  Acute on chronic CHF  Volume appears good  I would check BMET in AM now that she is on PO lasix     2  PAT  Follow    3  SSS  S/p PPM  4  Milds AS      LOS: 2 days   Dietrich Patesaula Tashika Goodin 01/16/2016, 9:42 AM

## 2016-01-16 NOTE — NC FL2 (Signed)
[place] MEDICAID FL2 LEVEL OF CARE SCREENING TOOL     IDENTIFICATION  Patient Name: Kathy BravoMarion Pressman Birthdate: 1927-09-09 Sex: female Admission Date (Current Location): 01/14/2016  [facility] [place] [place]   Facility and Address:  [facility],  [address], [place] [id number]      Provider Number: [id number]  Attending Physician Name and Address:  Eddie NorthNishant Dhungel, MD  Relative Name and Phone Number:       Current Level of Care: Hospital Recommended Level of Care: Skilled Nursing Facility Prior Approval Number:    Date Approved/Denied:   PASRR Number: [id number]  Discharge Plan: SNF    Current Diagnoses: Patient Active Problem List   Diagnosis Date Noted  . Acute CHF (congestive heart failure) (HCC) 01/14/2016  . Sick sinus syndrome (HCC) 01/14/2016  . Acute respiratory failure with hypoxia (HCC) 01/14/2016  . Complete heart block (HCC) 01/14/2016  . Ischemic heart disease 10/16/2013  . Pacemaker 10/16/2013  . Heart murmur, aortic 10/16/2013  . Left carotid bruit 10/16/2013  . Aortic valve defect 10/16/2013  . Chronic ischemic heart disease 10/16/2013  . Cardiovascular symptoms 10/16/2013  . Decreased pulse 10/16/2013  . 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 Arteriosclerosis of coronary artery 11/30/2010  . DD (diverticular disease) 11/30/2010  . HLD (hyperlipidemia) 11/30/2010  . Essential hypertension 11/30/2010  . Block, bundle branch, left 11/30/2010  . OP (osteoporosis) 11/30/2010  . H/O cataract extraction 11/30/2010    Orientation RESPIRATION BLADDER Height & Weight     Self, Place ("Forgetful")  O2 (2L/min) Continent Weight: 100 lb 12 oz (45.7 kg) Height:  5\' 5"  (165.1 cm)  BEHAVIORAL SYMPTOMS/MOOD NEUROLOGICAL BOWEL NUTRITION STATUS      Continent Diet (Heart Healthy)  AMBULATORY STATUS COMMUNICATION OF NEEDS Skin   Limited Assist Verbally Other (Comment) (Moisture associated skin barriers: bilateral  buttocks)                       Personal Care Assistance Level of Assistance  Bathing, Dressing Bathing Assistance: Limited assistance   Dressing Assistance: Limited assistance     Functional Limitations Info  Sight Sight Info: Impaired (glasses)        SPECIAL CARE FACTORS FREQUENCY  PT (By licensed PT), OT (By licensed OT)     PT Frequency: 5 OT Frequency: 5            Contractures Contractures Info: Present    Additional Factors Info  Code Status, Allergies Code Status Info: DNR Allergies Info: Clonidine Derivitives           Current Medications (01/16/2016):  This is the current hospital active medication list Current Facility-Administered Medications  Medication Dose Route Frequency Provider Last Rate Last Dose  . 0.9 %  sodium chloride infusion  250 mL Intravenous PRN Narda Bondsalph A Nettey, MD      . acetaminophen (TYLENOL) tablet 650 mg  650 mg Oral Q4H PRN Narda Bondsalph A Nettey, MD      . amLODipine (NORVASC) tablet 10 mg  10 mg Oral Daily Narda Bondsalph A Nettey, MD   10 mg at 01/16/16 1110  . aspirin chewable tablet 81 mg  81 mg Oral Daily Narda Bondsalph A Nettey, MD   81 mg at 01/16/16 1109  . atenolol (TENORMIN) tablet 50 mg  50 mg Oral BID Narda Bondsalph A Nettey, MD   50 mg at 01/16/16 2109  . clopidogrel (PLAVIX) tablet 75 mg  75 mg Oral Daily Narda Bondsalph A Nettey, MD  75 mg at 01/16/16 1109  . donepezil (ARICEPT) tablet 5 mg  5 mg Oral QHS Narda Bondsalph A Nettey, MD   5 mg at 01/16/16 2109  . enoxaparin (LOVENOX) injection 30 mg  30 mg Subcutaneous Q24H Aleda GranaDustin G Zeigler, RPH   30 mg at 01/16/16 2109  . feeding supplement (ENSURE ENLIVE) (ENSURE ENLIVE) liquid 237 mL  237 mL Oral BID BM Nishant Dhungel, MD   237 mL at 01/16/16 1427  . furosemide (LASIX) tablet 40 mg  40 mg Oral Daily Nishant Dhungel, MD   40 mg at 01/16/16 1740  . lisinopril (PRINIVIL,ZESTRIL) tablet 40 mg  40 mg Oral Daily Narda Bondsalph A Nettey, MD   40 mg at 01/16/16 1109  . ondansetron (ZOFRAN) injection 4 mg  4 mg Intravenous Q6H PRN  Narda Bondsalph A Nettey, MD      . potassium chloride SA (K-DUR,KLOR-CON) CR tablet 20 mEq  20 mEq Oral BID Narda Bondsalph A Nettey, MD   20 mEq at 01/16/16 2109  . simvastatin (ZOCOR) tablet 20 mg  20 mg Oral Daily Narda Bondsalph A Nettey, MD   20 mg at 01/16/16 1110  . sodium chloride flush (NS) 0.9 % injection 3 mL  3 mL Intravenous Q12H Narda Bondsalph A Nettey, MD   3 mL at 01/16/16 2113  . sodium chloride flush (NS) 0.9 % injection 3 mL  3 mL Intravenous PRN Narda Bondsalph A Nettey, MD         Discharge Medications: Please see discharge summary for a list of discharge medications.  Relevant Imaging Results:  Relevant Lab Results:   Additional Information SSN:  [id number]  Darylene PriceCrowder, Brecklynn Jian T, LCSW

## 2016-01-17 DIAGNOSIS — I5031 Acute diastolic (congestive) heart failure: Secondary | ICD-10-CM | POA: Diagnosis present

## 2016-01-17 DIAGNOSIS — E876 Hypokalemia: Secondary | ICD-10-CM

## 2016-01-17 DIAGNOSIS — I10 Essential (primary) hypertension: Secondary | ICD-10-CM

## 2016-01-17 DIAGNOSIS — N179 Acute kidney failure, unspecified: Secondary | ICD-10-CM | POA: Diagnosis not present

## 2016-01-17 DIAGNOSIS — F05 Delirium due to known physiological condition: Secondary | ICD-10-CM | POA: Diagnosis not present

## 2016-01-17 DIAGNOSIS — F03918 Unspecified dementia, unspecified severity, with other behavioral disturbance: Secondary | ICD-10-CM | POA: Diagnosis present

## 2016-01-17 DIAGNOSIS — F0390 Unspecified dementia without behavioral disturbance: Secondary | ICD-10-CM

## 2016-01-17 DIAGNOSIS — I442 Atrioventricular block, complete: Secondary | ICD-10-CM

## 2016-01-17 DIAGNOSIS — F0391 Unspecified dementia with behavioral disturbance: Secondary | ICD-10-CM | POA: Diagnosis present

## 2016-01-17 LAB — BASIC METABOLIC PANEL
ANION GAP: 9 (ref 5–15)
BUN: 37 mg/dL — ABNORMAL HIGH (ref 6–20)
CHLORIDE: 96 mmol/L — AB (ref 101–111)
CO2: 30 mmol/L (ref 22–32)
Calcium: 9.1 mg/dL (ref 8.9–10.3)
Creatinine, Ser: 1.19 mg/dL — ABNORMAL HIGH (ref 0.44–1.00)
GFR calc Af Amer: 47 mL/min — ABNORMAL LOW (ref >60)
GFR, EST NON AFRICAN AMERICAN: 40 mL/min — AB (ref >60)
Glucose, Bld: 104 mg/dL — ABNORMAL HIGH (ref 65–99)
POTASSIUM: 4 mmol/L (ref 3.5–5.1)
SODIUM: 135 mmol/L (ref 135–145)

## 2016-01-17 MED ORDER — FUROSEMIDE 40 MG PO TABS: 40.0000 mg | ORAL_TABLET | Freq: Every day | ORAL | 0 refills | Status: DC

## 2016-01-17 MED ORDER — ENSURE ENLIVE PO LIQD: 237.0000 mL | Freq: Two times a day (BID) | ORAL | 12 refills | Status: DC

## 2016-01-17 NOTE — Care Management Note (Signed)
Case Management Note  Patient Details  Name: Marijean BravoMarion Dumler MRN: [id number] Date of Birth: Dec 07, 1927  Subjective/Objective:    CHF, AKI                Action/Plan: Discharge Planning: AVS reviewed: Chart reviewed. CSW following for SNF placement. Scheduled dc to SNF.    Expected Discharge Date: 01/17/2016              Expected Discharge Plan:  Skilled Nursing Facility  In-House Referral:  Clinical Social Work  Discharge planning Services  CM Consult  Post Acute Care Choice:  NA Choice offered to:  NA  DME Arranged:  N/A DME Agency:  NA  HH Arranged:  NA HH Agency:  NA  Status of Service:  Completed, signed off  If discussed at [employer] Length of Stay Meetings, dates discussed:    Additional Comments:  Elliot CousinShavis, Lyndon [place] Ellen, RN 01/17/2016, 1:43 PM

## 2016-01-17 NOTE — Progress Notes (Addendum)
[facility] Anticipated discharge date: 12/3 Family notified: Gaylyn RongKris Transportation by Sharin MonsPTAR- scheduled for 1:30pm  CSW signing off.  Burna SisJenna H. Levon Boettcher, LCSW Clinical Social Worker [phone]

## 2016-01-17 NOTE — Clinical Social Work Placement (Signed)
   CLINICAL SOCIAL WORK PLACEMENT  NOTE  Date:  01/17/2016  Patient Details  Name: Kathy BravoMarion Solomon MRN: [id number] Date of Birth: 26-Jan-1928  Clinical Social Work is seeking post-discharge placement for this patient at the Skilled  Nursing Facility level of care (*CSW will initial, date and re-position this form in  chart as items are completed):  Yes   Patient/family provided with [place] Clinical Social Work Department's list of facilities offering this level of care within the geographic area requested by the patient (or if unable, by the patient's family).  Yes   Patient/family informed of their freedom to choose among providers that offer the needed level of care, that participate in Medicare, Medicaid or managed care program needed by the patient, have an available bed and are willing to accept the patient.  Yes   Patient/family informed of [place]'s ownership interest in [facility] and [facility], as well as of the fact that they are under no obligation to receive care at these facilities.  PASRR submitted to EDS on 01/16/16     PASRR number received on 01/16/16     Existing PASRR number confirmed on       FL2 transmitted to all facilities in geographic area requested by pt/family on 01/16/16     FL2 transmitted to all facilities within larger geographic area on       Patient informed that his/her managed care company has contracts with or will negotiate with certain facilities, including the following:        Yes   Patient/family informed of bed offers received.  [facility]     Physician recommends and patient chooses bed at      [facility] on 01/17/[facility].  Patient to be transferred to facility by ptar     Patient family notified on 01/17/16 of transfer.  Name of family member notified:  Gaylyn RongKris     PHYSICIAN Please prepare priority discharge summary, including medications, Please sign DNR, Please  prepare prescriptions, Please sign FL2     Additional Comment:    _______________________________________________ Burna SisUris, Cortne Amara H, LCSW 01/17/2016, 12:29 PM

## 2016-01-17 NOTE — Care Management Important Message (Signed)
Important Message  Patient Details  Name: Kathy Solomon MRN: [id number] Date of Birth: November 30, 1927   Medicare Important Message Given:  Yes    Elliot CousinShavis, [place] Acklin Ellen, RN 01/17/2016, 1:01 PM

## 2016-01-17 NOTE — Discharge Instructions (Signed)
Heart Failure  Heart failure means your heart has trouble pumping blood. This makes it hard for your body to work well. Heart failure is usually a long-term (chronic) condition. You must take good care of yourself and follow your doctor's treatment plan.  HOME CARE   Take your heart medicine as told by your doctor.    Do not stop taking medicine unless your doctor tells you to.    Do not skip any dose of medicine.    Refill your medicines before they run out.    Take other medicines only as told by your doctor or pharmacist.   Stay active if told by your doctor. The elderly and people with severe heart failure should talk with a doctor about physical activity.   Eat heart-healthy foods. Choose foods that are without trans fat and are low in saturated fat, cholesterol, and salt (sodium). This includes fresh or frozen fruits and vegetables, fish, lean meats, fat-free or low-fat dairy foods, whole grains, and high-fiber foods. Lentils and dried peas and beans (legumes) are also good choices.   Limit salt if told by your doctor.   Cook in a healthy way. Roast, grill, broil, bake, poach, steam, or stir-fry foods.   Limit fluids as told by your doctor.   Weigh yourself every morning. Do this after you pee (urinate) and before you eat breakfast. Write down your weight to give to your doctor.   Take your blood pressure and write it down if your doctor tells you to.   Ask your doctor how to check your pulse. Check your pulse as told.   Lose weight if told by your doctor.   Stop smoking or chewing tobacco. Do not use gum or patches that help you quit without your doctor's approval.   Schedule and go to doctor visits as told.   Nonpregnant women should have no more than 1 drink a day. Men should have no more than 2 drinks a day. Talk to your doctor about drinking alcohol.   Stop illegal drug use.   Stay current with shots (immunizations).   Manage your health conditions as told by your doctor.   Learn to  manage your stress.   Rest when you are tired.   If it is really hot outside:    Avoid intense activities.    Use air conditioning or fans, or get in a cooler place.    Avoid caffeine and alcohol.    Wear loose-fitting, lightweight, and light-colored clothing.   If it is really cold outside:    Avoid intense activities.    Layer your clothing.    Wear mittens or gloves, a hat, and a scarf when going outside.    Avoid alcohol.   Learn about heart failure and get support as needed.   Get help to maintain or improve your quality of life and your ability to care for yourself as needed.  GET HELP IF:    You gain weight quickly.   You are more short of breath than usual.   You cannot do your normal activities.   You tire easily.   You cough more than normal, especially with activity.   You have any or more puffiness (swelling) in areas such as your hands, feet, ankles, or belly (abdomen).   You cannot sleep because it is hard to breathe.   You feel like your heart is beating fast (palpitations).   You get dizzy or light-headed when you stand up.  GET HELP   RIGHT AWAY IF:    You have trouble breathing.   There is a change in mental status, such as becoming less alert or not being able to focus.   You have chest pain or discomfort.   You faint.  MAKE SURE YOU:    Understand these instructions.   Will watch your condition.   Will get help right away if you are not doing well or get worse.     This information is not intended to replace advice given to you by your health care provider. Make sure you discuss any questions you have with your health care provider.     Document Released: 11/10/2007 Document Revised: 02/21/2014 Document Reviewed: 03/19/2012  Elsevier Interactive Patient Education 2017 [employer]vier Inc[employer]

## 2016-01-17 NOTE — Progress Notes (Deleted)
PROGRESS NOTE                                                                                                                                                                                                             Patient Demographics:    Kathy Solomon, is a 82 y.o. female, DOB - June 10, 1927, ZOX:[id number]  Admit date - 01/14/2016   Admitting Physician Narda Bonds, MD  Outpatient Primary MD for the patient is Devra Dopp, MD  LOS - 3  Outpatient Specialists: Cardiology  Chief Complaint  Patient presents with  . Respiratory Distress       Brief Narrative   82 year old female with complete heart block with sick sinus syndrome status post [employer] PPM, ischemic cardiomyopathy  status post PCI IN 1994 in [place], presented to [facility] hospital for acute onset of shortness of breath. Also had increasing leg swellings for the past 2 weeks. Patient found to be no acute hypoxic respiratory failure by EMS (O2 sat of 76%) required NRB and brought to the ED. Chest x-ray showed bibasilar airspace opacities with vascular congestion. BNP of 580. Patient found to have acute diastolic congestive heart failure.   Subjective:   Imipenem was better today.  Assessment  & Plan :    Principal Problem:   Acute CHF (congestive heart failure) (HCC) diuresed well.On daily Lasix. Appreciate cardiology eval.  2-D echo with normal EF, moderate MR. Continue aspirin, Plavix, beta blocker and statin.   Active Problems: Paroxysmal atrial tachycardia Device interrogated. Adjust Beta blocker for better rate control.   Acute kidney injury Mild secondary to diuretic. Monitor closely.    history of ischemic cardiomyopathy Continue aspirin, Plavix, statin and beta blocker. EF normal on current echo.   Sick sinus syndrome [facility]) pacemaker interrogated on admission.  hypokalemia/hypomagnesemia Replenished  Weakness  and unsteady gait PT evaluation recommended SNF.Social work discussed with patient and her daughter in detail.   Dementia Mild. Daughter reports sundowning at home. continue namenda.No issues while in the hospital.    Code Status : DO NOT RESUSCITATE   Family Communication  :spokewith daughter at bedside on 12/2.  disposition: SNF on 12/4  Consults  :   Cardiology   Procedures  :  2-D echo Pacemaker interrogation   DVT prophylaxis  :  Lovenox -   Lab Results  Component  Value Date   PLT 226 01/14/2016    Antibiotics  :   Anti-infectives    None        Objective:   Vitals:   01/16/16 0608 01/16/16 1300 01/16/16 2242 01/17/16 0706  BP: (!) 141/61 121/69 107/72 135/80  Pulse: 77 68 64 68  Resp: 18 18 18 18   Temp: 98 F (36.7 C) 98.2 F (36.8 C) 97.8 F (36.6 C) 98.1 F (36.7 C)  TempSrc: Oral Oral Oral Oral  SpO2: 95% 93% 92% 92%  Weight: 45.7 kg (100 lb 12 oz)   45.9 kg (101 lb 3.1 oz)  Height:        Wt Readings from Last 3 Encounters:  01/17/16 45.9 kg (101 lb 3.1 oz)  04/13/15 48.3 kg (106 lb 6.4 oz)  03/03/15 48.1 kg (106 lb 1.9 oz)     Intake/Output Summary (Last 24 hours) at 01/17/16 0956 Last data filed at 01/17/16 0800  Gross per 24 hour  Intake             1200 ml  Output              625 ml  Net              575 ml     Physical Exam  Gen: not in distress HEENT:  moist mucosa, supple neck Chest: clear b/l, no added sounds CVS: N S1&S2, no murmurs,  GI: soft, NT, ND Musculoskeletal: warm,Leg edema resolved.    Data Review:    CBC  Recent Labs Lab 01/14/16 0611  WBC 7.6  HGB 12.4  HCT 38.3  PLT 226  MCV 90.8  MCH 29.4  MCHC 32.4  RDW 15.5    Chemistries   Recent Labs Lab 01/14/16 0611 01/15/16 0446 01/16/16 0534 01/17/16 0524  NA 137 141 138 135  K 3.3* 2.8* 3.9 4.0  CL 105 99* 98* 96*  CO2 24 30 32 30  GLUCOSE 120* 97 98 104*  BUN 19 18 28* 37*  CREATININE 0.72 0.82 1.10* 1.19*  CALCIUM 8.8* 8.7* 8.8*  9.1  MG  --  1.7  --   --    ------------------------------------------------------------------------------------------------------------------ No results for input(s): CHOL, HDL, LDLCALC, TRIG, CHOLHDL, LDLDIRECT in the last 72 hours.  No results found for: HGBA1C ------------------------------------------------------------------------------------------------------------------  Recent Labs  01/15/16 0446  TSH 3.126   ------------------------------------------------------------------------------------------------------------------ No results for input(s): VITAMINB12, FOLATE, FERRITIN, TIBC, IRON, RETICCTPCT in the last 72 hours.  Coagulation profile No results for input(s): INR, PROTIME in the last 168 hours.   Recent Labs  01/15/16 0446  DDIMER 1.63*    Cardiac Enzymes No results for input(s): CKMB, TROPONINI, MYOGLOBIN in the last 168 hours.  Invalid input(s): CK ------------------------------------------------------------------------------------------------------------------    Component Value Date/Time   BNP 580.1 (H) 01/14/2016 [id number]    Inpatient Medications  Scheduled Meds: . amLODipine  10 mg Oral Daily  . aspirin  81 mg Oral Daily  . atenolol  50 mg Oral BID  . clopidogrel  75 mg Oral Daily  . donepezil  5 mg Oral QHS  . enoxaparin (LOVENOX) injection  30 mg Subcutaneous Q24H  . feeding supplement (ENSURE ENLIVE)  237 mL Oral BID BM  . furosemide  40 mg Oral Daily  . lisinopril  40 mg Oral Daily  . potassium chloride  20 mEq Oral BID  . simvastatin  20 mg Oral Daily  . sodium chloride flush  3 mL Intravenous Q12H   Continuous Infusions: PRN  Meds:.sodium chloride, acetaminophen, ondansetron (ZOFRAN) IV, sodium chloride flush  Micro Results No results found for this or any previous visit (from the past 240 hour(s)).  Radiology Reports Dg Chest 2 View  Result Date: 01/14/2016 CLINICAL DATA:  Acute onset of worsening shortness of breath. Decreased O2  saturation. Initial encounter. EXAM: CHEST  2 VIEW COMPARISON:  None. FINDINGS: The lungs are hyperexpanded, with flattening of the hemidiaphragms, compatible with COPD. Bibasilar airspace opacities are noted. Vascular congestion is noted. Increased interstitial markings raise concern for pulmonary edema. Small bilateral pleural effusions are seen. The heart is borderline normal in size. No acute osseous abnormalities are identified. A pacemaker is noted at the right chest wall, with a lead ending at the right ventricle. IMPRESSION: 1. Bibasilar airspace opacities noted. Vascular congestion. Increased interstitial markings raise concern for pulmonary edema. Small bilateral pleural effusions seen. 2. Findings of COPD. Electronically Signed   By: Roanna RaiderJeffery  Chang M.D.   On: 01/14/2016 06:55    Time Spent in minutes  25   Eddie NorthHUNGEL, Prabhnoor Ellenberger M.D on 01/17/2016 at 9:56 AM  Between 7am to 7pm - Pager - [phone]  After 7pm go to [email or web address] - password [email or web address]  [facility] -  Office  [phone]

## 2016-01-17 NOTE — Discharge Summary (Signed)
Physician Discharge Summary  Kathy Solomon YQM:[id number] DOB: 01-08-1928 DOA: 01/14/2016  PCP: Kathy Solomon, TAMIEKA, MD  Admit date: 01/14/2016 Discharge date: 01/17/2016  Admitted From: Home Disposition: SNF ([facility])  Recommendations for Outpatient Follow-up:  1. Follow up with M.D. at SNF in 1 week. Patient and daughter are interested in transitioning to assisted living after being discharged from SNF. 2. Please monitor renal function and potassium in next 2-3 days. 3. Follow-up with [facility] the cardiology in 4 weeks.  Home Health:No Equipment/Devices: per therapy at SNF  Discharge Condition: Stable CODE STATUS: DO NOT RESUSCITATE Diet recommendation: Heart healthy   Discharge Diagnoses:  Principal Problem:   Acute diastolic congestive heart failure (HCC)   Active Problems:   Ischemic heart disease   Pacemaker   HLD (hyperlipidemia)   Essential hypertension   Sick sinus syndrome (HCC)   Acute respiratory failure with hypoxia (HCC)   Complete heart block (HCC)   Dementia without behavioral disturbance   Acute kidney injury (HCC)   Hypokalemia   Sundowning  Brief narrative/history of present illness Please refer to admission H&P for details, in brief,82 year old female with complete heart block with sick sinus syndrome status post St. Jude's PPM, ischemic cardiomyopathy  status post PCI  (1994 in [place]), [facility] for acute onset of shortness of breath. Also had increasing leg swellings for the past 2 weeks. Patient found to be no acute hypoxic respiratory failure by EMS (O2 sat of 76%) required NRB and brought to the ED. Chest x-ray showed bibasilar airspace opacities with vascular congestion. BNP of 580. Patient found to have acute diastolic congestive heart failure.  Principal Problem:   Acute diastolic CHF (congestive heart failure) (HCC) diuresed well. Switched to by mouth Lasix 40 mg daily. Appreciate cardiology eval.   2-D echo with  normal EF, moderate MR. Continue aspirin, Plavix, beta blocker and statin.   Active Problems: Paroxysmal atrial tachycardia Device interrogated. Adjust Beta blocker for better rate control.   Acute kidney injury Mild secondary to diuretic. Follow-up renal function in next 2-3 days.    history of ischemic cardiomyopathy Continue aspirin, Plavix, statin and beta blocker. EF normal on current echo.   Sick sinus syndrome [facility](HCC) pacemaker interrogated on admission.  hypokalemia/hypomagnesemia Replenished  Weakness and unsteady gait PT evaluation recommended SNF. Added protein supplement. patient and her daughter agree with it and are interested in transitioning her to an assisted living.   Dementia with history of sundowning Mild. Daughter reports sundowning at home. continue namenda. No issues while in the hospital. Please monitor at the facility.      Family Communication  :spoke with daughter on the phone.  Consults  :   Cardiology   Procedures  :  2-D echo Pacemaker interrogation  Discharge Instructions     Medication List    TAKE these medications   amLODipine 10 MG tablet Commonly known as:  NORVASC Take 10 mg by mouth daily.   aspirin 81 MG tablet Take 81 mg by mouth daily.   atenolol 100 MG tablet Commonly known as:  TENORMIN Take 50 mg by mouth 2 (two) times daily.   clopidogrel 75 MG tablet Commonly known as:  PLAVIX Take 75 mg by mouth daily.   donepezil 5 MG tablet Commonly known as:  ARICEPT Take 5 mg by mouth at bedtime.   feeding supplement (ENSURE ENLIVE) Liqd Take 237 mLs by mouth 2 (two) times daily between meals.   furosemide 40 MG tablet Commonly known as:  LASIX  Take 1 tablet (40 mg total) by mouth daily.   lisinopril 40 MG tablet Commonly known as:  PRINIVIL,ZESTRIL Take 40 mg by mouth daily.   simvastatin 20 MG tablet Commonly known as:  ZOCOR Take 20 mg by mouth daily.            Durable Medical  Equipment        Start     Ordered   01/15/16 1328  For home use only DME Walker rolling  Once    Question:  Patient needs a walker to treat with the following condition  Answer:  Balance problem   01/15/16 1328     Follow-up Information    MD at SNF Follow up.          Allergies  Allergen Reactions  . Clonidine Derivatives Nausea Only and Anxiety     "feel like a zombie"      Procedures/Studies: Dg Chest 2 View  Result Date: 01/14/2016 CLINICAL DATA:  Acute onset of worsening shortness of breath. Decreased O2 saturation. Initial encounter. EXAM: CHEST  2 VIEW COMPARISON:  None. FINDINGS: The lungs are hyperexpanded, with flattening of the hemidiaphragms, compatible with COPD. Bibasilar airspace opacities are noted. Vascular congestion is noted. Increased interstitial markings raise concern for pulmonary edema. Small bilateral pleural effusions are seen. The heart is borderline normal in size. No acute osseous abnormalities are identified. A pacemaker is noted at the right chest wall, with a lead ending at the right ventricle. IMPRESSION: 1. Bibasilar airspace opacities noted. Vascular congestion. Increased interstitial markings raise concern for pulmonary edema. Small bilateral pleural effusions seen. 2. Findings of COPD. Electronically Signed   By: Kathy RaiderJeffery  Solomon M.D.   On: 01/14/2016 06:55    2-D echo Study Conclusions  - Left ventricle: The cavity size was normal. Systolic function was   normal. The estimated ejection fraction was in the range of 60%   to 65%. Wall motion was normal; there were no regional wall   motion abnormalities. - Aortic valve: Trileaflet; mildly thickened, mildly calcified   leaflets. - Mitral valve: Moderately to severely calcified annulus. There was   moderate regurgitation. - Tricuspid valve: There was mild regurgitation. - Pulmonary arteries: Systolic pressure was mildly increased. PA   peak pressure: 41 mm Hg (S).  Subjective: Feels her  breathing to be much better.  Discharge Exam: Vitals:   01/16/16 2242 01/17/16 0706  BP: 107/72 135/80  Pulse: 64 68  Resp: 18 18  Temp: 97.8 F (36.6 C) 98.1 F (36.7 C)   Vitals:   01/16/16 0608 01/16/16 1300 01/16/16 2242 01/17/16 0706  BP: (!) 141/61 121/69 107/72 135/80  Pulse: 77 68 64 68  Resp: 18 18 18 18   Temp: 98 F (36.7 C) 98.2 F (36.8 C) 97.8 F (36.6 C) 98.1 F (36.7 C)  TempSrc: Oral Oral Oral Oral  SpO2: 95% 93% 92% 92%  Weight: 45.7 kg (100 lb 12 oz)   45.9 kg (101 lb 3.1 oz)  Height:         Gen: not in distress HEENT:  moist mucosa, supple neck Chest: clear b/l, no added sounds CVS: N S1&S2, no murmurs,  GI: soft, NT, ND Musculoskeletal: warm,Leg edema resolved.    The results of significant diagnostics from this hospitalization (including imaging, microbiology, ancillary and laboratory) are listed below for reference.     Microbiology: No results found for this or any previous visit (from the past 240 hour(s)).   Labs: BNP (last 3 results)  Recent Labs  01/14/16 0611  BNP 580.1*   Basic Metabolic Panel:  Recent Labs Lab 01/14/16 0611 01/15/16 0446 01/16/16 0534 01/17/16 0524  NA 137 141 138 135  K 3.3* 2.8* 3.9 4.0  CL 105 99* 98* 96*  CO2 24 30 32 30  GLUCOSE 120* 97 98 104*  BUN 19 18 28* 37*  CREATININE 0.72 0.82 1.10* 1.19*  CALCIUM 8.8* 8.7* 8.8* 9.1  MG  --  1.7  --   --    Liver Function Tests: No results for input(s): AST, ALT, ALKPHOS, BILITOT, PROT, ALBUMIN in the last 168 hours. No results for input(s): LIPASE, AMYLASE in the last 168 hours. No results for input(s): AMMONIA in the last 168 hours. CBC:  Recent Labs Lab 01/14/16 0611  WBC 7.6  HGB 12.4  HCT 38.3  MCV 90.8  PLT 226   Cardiac Enzymes: No results for input(s): CKTOTAL, CKMB, CKMBINDEX, TROPONINI in the last 168 hours. BNP: Invalid input(s): POCBNP CBG: No results for input(s): GLUCAP in the last 168 hours. D-Dimer  Recent Labs   01/15/16 0446  DDIMER 1.63*   Hgb A1c No results for input(s): HGBA1C in the last 72 hours. Lipid Profile No results for input(s): CHOL, HDL, LDLCALC, TRIG, CHOLHDL, LDLDIRECT in the last 72 hours. Thyroid function studies  Recent Labs  01/15/16 0446  TSH 3.126   Anemia work up No results for input(s): VITAMINB12, FOLATE, FERRITIN, TIBC, IRON, RETICCTPCT in the last 72 hours. Urinalysis No results found for: COLORURINE, APPEARANCEUR, LABSPEC, PHURINE, GLUCOSEU, HGBUR, BILIRUBINUR, KETONESUR, PROTEINUR, UROBILINOGEN, NITRITE, LEUKOCYTESUR Sepsis Labs Invalid input(s): PROCALCITONIN,  WBC,  LACTICIDVEN Microbiology No results found for this or any previous visit (from the past 240 hour(s)).   Time coordinating discharge: Over 30 minutes  SIGNED:   Eddie North, MD  [facility] 01/17/2016, 10:29 AM Pager   If 7PM-7AM, please contact night-coverage [email or web address] Password [email or web address]

## 2016-01-17 NOTE — Progress Notes (Signed)
Note plans for d.c today Will make sure pt has f/u in cardiology

## 2016-01-18 ENCOUNTER — Encounter: Payer: Medicare Other | Admitting: Cardiovascular Disease

## 2016-01-18 ENCOUNTER — Encounter: Payer: Self-pay | Admitting: Adult Health

## 2016-01-18 ENCOUNTER — Telehealth: Payer: Self-pay | Admitting: *Deleted

## 2016-01-18 ENCOUNTER — Non-Acute Institutional Stay (SKILLED_NURSING_FACILITY): Payer: Medicare Other | Admitting: Adult Health

## 2016-01-18 DIAGNOSIS — I10 Essential (primary) hypertension: Secondary | ICD-10-CM

## 2016-01-18 DIAGNOSIS — I5031 Acute diastolic (congestive) heart failure: Secondary | ICD-10-CM | POA: Diagnosis not present

## 2016-01-18 DIAGNOSIS — R531 Weakness: Secondary | ICD-10-CM

## 2016-01-18 DIAGNOSIS — I480 Paroxysmal atrial fibrillation: Secondary | ICD-10-CM | POA: Diagnosis not present

## 2016-01-18 DIAGNOSIS — I259 Chronic ischemic heart disease, unspecified: Secondary | ICD-10-CM | POA: Diagnosis not present

## 2016-01-18 DIAGNOSIS — N179 Acute kidney failure, unspecified: Secondary | ICD-10-CM

## 2016-01-18 DIAGNOSIS — I495 Sick sinus syndrome: Secondary | ICD-10-CM

## 2016-01-18 DIAGNOSIS — F0390 Unspecified dementia without behavioral disturbance: Secondary | ICD-10-CM

## 2016-01-18 NOTE — Progress Notes (Signed)
DATE:  01/18/2016   MRN:  [id number]  BIRTHDAY: June 13, 1927  Facility:  Nursing Home Location:  [facility]  Nursing Home Room Number: 106-P  LEVEL OF CARE:  SNF (31)  Contact Information    Name Relation Home Work [place]   Berger,[name] Daughter [phone]  [phone]       Code Status History    Date Active Date Inactive Code Status Order ID Comments User Context   01/14/2016  1:28 PM 01/17/2016  5:14 PM DNR [id number]  Narda Bondsalph A Nettey, MD Inpatient    Questions for Most Recent Historical Code Status (Order [id number])    Question Answer Comment   In the event of cardiac or respiratory ARREST Do not call a "code blue"    In the event of cardiac or respiratory ARREST Do not perform Intubation, CPR, defibrillation or ACLS    In the event of cardiac or respiratory ARREST Use medication by any route, position, wound care, and other measures to relive pain and suffering. May use oxygen, suction and manual treatment of airway obstruction as needed for comfort.         Advance Directive Documentation   Flowsheet Row Most Recent Value  Type of Advance Directive  Out of facility DNR (pink MOST or yellow form)  Pre-existing out of facility DNR order (yellow form or pink MOST form)  No data  "MOST" Form in Place?  No data       Chief Complaint  Patient presents with  . Hospitalization Follow-up    HISTORY OF PRESENT ILLNESS:  This is an 82 year old female who has PMH of complete heart block with sick sinus syndrome S/P St. Jude's PPM and ischemic cardiomyopathy S/P PCI (1994). He was hospitalized for acute diastolic CHF with BNP of 580. Cardiology was consulted and she was diuresed then switched to PO Lasix.   She has been admitted for a short-term rehabilitation.   PAST MEDICAL HISTORY:  Past Medical History:  Diagnosis Date  . Complete heart block (HCC)   . Heart murmur   . Ischemic heart disease   . Left carotid bruit   . Pacemaker - St Judes    VDD Lead      CURRENT MEDICATIONS: Reviewed  Patient's Medications  New Prescriptions   No medications on file  Previous Medications   AMLODIPINE (NORVASC) 10 MG TABLET    Take 10 mg by mouth daily.    ASPIRIN 81 MG TABLET    Take 81 mg by mouth daily.   ATENOLOL (TENORMIN) 100 MG TABLET    Take 50 mg by mouth 2 (two) times daily. Take 1/2 tablet to = 50 mg bid   CLOPIDOGREL (PLAVIX) 75 MG TABLET    Take 75 mg by mouth daily.    DONEPEZIL (ARICEPT) 5 MG TABLET    Take 5 mg by mouth at bedtime.    FUROSEMIDE (LASIX) 40 MG TABLET    Take 1 tablet (40 mg total) by mouth daily.   LISINOPRIL (PRINIVIL,ZESTRIL) 40 MG TABLET    Take 40 mg by mouth daily.    NUTRITIONAL SUPPLEMENT LIQD    Take 120 mLs by mouth 2 (two) times daily.   SIMVASTATIN (ZOCOR) 20 MG TABLET    Take 20 mg by mouth daily.   Modified Medications   No medications on file  Discontinued Medications   FEEDING SUPPLEMENT, ENSURE ENLIVE, (ENSURE ENLIVE) LIQD    Take 237 mLs by mouth 2 (two) times daily between meals.  Allergies  Allergen Reactions  . Clonidine Derivatives Nausea Only and Anxiety     "feel like a zombie"     REVIEW OF SYSTEMS:  GENERAL: no change in appetite, no fatigue, no weight changes, no fever, chills or weakness EYES: Denies change in vision, dry eyes, eye pain, itching or discharge EARS: Denies change in hearing, ringing in ears, or earache NOSE: Denies nasal congestion or epistaxis MOUTH and THROAT: Denies oral discomfort, gingival pain or bleeding, pain from teeth or hoarseness   RESPIRATORY: no cough, SOB, DOE, wheezing, hemoptysis CARDIAC: no chest pain, edema or palpitations GI: no abdominal pain, diarrhea, constipation, heart burn, nausea or vomiting GU: Denies dysuria, frequency, hematuria, incontinence, or discharge PSYCHIATRIC: Denies feeling of depression or anxiety. No report of hallucinations, insomnia, paranoia, or agitation    PHYSICAL EXAMINATION  GENERAL APPEARANCE:   In no acute distress.  SKIN:  Skin is warm and dry.  HEAD: Normal in size and contour. No evidence of trauma EYES: Lids open and close normally. No blepharitis, entropion or ectropion. PERRL. Conjunctivae are clear and sclerae are white. Lenses are without opacity EARS: Pinnae are normal. Patient hears normal voice tunes of the examiner MOUTH and THROAT: Lips are without lesions. Oral mucosa is moist and without lesions. Tongue is normal in shape, size, and color and without lesions NECK: supple, trachea midline, no neck masses, no thyroid tenderness, no thyromegaly LYMPHATICS: no LAN in the neck, no supraclavicular LAN RESPIRATORY: breathing is even & unlabored, BS CTAB CARDIAC: RRR, no murmur,no extra heart sounds, BLE trace edema, right chest pacemaker GI: abdomen soft, normal BS, no masses, no tenderness, no hepatomegaly, no splenomegaly EXTREMITIES:  Able to move 4 extremities PSYCHIATRIC: Alert to person, disoriented to time and place. Affect and behavior are appropriate  LABS/RADIOLOGY: Labs reviewed: Basic Metabolic Panel:  Recent Labs  16/11/9610/01/17 0446 01/16/16 0534 01/17/16 0524  NA 141 138 135  K 2.8* 3.9 4.0  CL 99* 98* 96*  CO2 30 32 30  GLUCOSE 97 98 104*  BUN 18 28* 37*  CREATININE 0.82 1.10* 1.19*  CALCIUM 8.7* 8.8* 9.1  MG 1.7  --   --    CBC:  Recent Labs  01/14/16 0611  WBC 7.6  HGB 12.4  HCT 38.3  MCV 90.8  PLT 226     Dg Chest 2 View  Result Date: 01/14/2016 CLINICAL DATA:  Acute onset of worsening shortness of breath. Decreased O2 saturation. Initial encounter. EXAM: CHEST  2 VIEW COMPARISON:  None. FINDINGS: The lungs are hyperexpanded, with flattening of the hemidiaphragms, compatible with COPD. Bibasilar airspace opacities are noted. Vascular congestion is noted. Increased interstitial markings raise concern for pulmonary edema. Small bilateral pleural effusions are seen. The heart is borderline normal in size. No acute osseous abnormalities are  identified. A pacemaker is noted at the right chest wall, with a lead ending at the right ventricle. IMPRESSION: 1. Bibasilar airspace opacities noted. Vascular congestion. Increased interstitial markings raise concern for pulmonary edema. Small bilateral pleural effusions seen. 2. Findings of COPD. Electronically Signed   By: Roanna RaiderJeffery  Chang M.D.   On: 01/14/2016 06:55    ASSESSMENT/PLAN:  Generalized weakness - for rehabilitation, PT and OT, for therapeutic strengthening exercises; fall precaution  Acute diastolic CHF   - EF normal, moderate MR ; cardiology was consulted, she was diuresed and switched to Lasix 40 mg PO daily; continue aspirin 81 mg by mouth daily, Plavix 75 mg 1 tab by mouth daily and lisinopril 40 mg  1 tab by mouth daily; follow-up with cardiology in 4 weeks   PAF - has pacemaker checked; continue atenolol 100 mg 1 tab by mouth twice a day  AKI - secondary to diuretic; follow-up with renal in 2-3 days, GFR 40; check BMP Lab Results  Component Value Date   CREATININE 1.19 (H) 01/17/2016   History of ischemic cardiomyopathy - EF normal; continue aspirin 81 mg 1 tab by mouth daily, Plavix 75 mg 1 tab by mouth daily, simvastatin 20 mg 1 tab by mouth daily and atenolol 100 mg 1 tab by mouth twice a day; check CBC  SSS - pacemaker was interrogated  Dementia with behavioral disturbance - continue Aricept 5 mg 1 tab by mouth daily at bedtime  Hypertension - continue Norvasc 10 mg 1 tab by mouth daily, atenolol 100 mg 1 tab by mouth twice a day and lisinopril 40 mg 1 tab by mouth daily    Goals of care:  Short-term rehabilitation    Kenard Gower - NP [facility] [phone]

## 2016-01-18 NOTE — Telephone Encounter (Signed)
Patient's daughter, Gaylyn RongKris, called to cancel Device Clinic appointment for today as [facility] is [facility] and cannot travel easily at this time.  She requests to also reschedule appointment with Dr. Elease HashimotoNahser to the same date.  Advised that I will have a scheduler contact her regarding rescheduling, but that both appointments are canceled at this time.  She is appreciative of assistance and agreeable to this plan.

## 2016-01-19 ENCOUNTER — Encounter: Payer: Self-pay | Admitting: Internal Medicine

## 2016-01-19 ENCOUNTER — Non-Acute Institutional Stay (SKILLED_NURSING_FACILITY): Payer: Medicare Other | Admitting: Internal Medicine

## 2016-01-19 DIAGNOSIS — F0390 Unspecified dementia without behavioral disturbance: Secondary | ICD-10-CM

## 2016-01-19 DIAGNOSIS — R2681 Unsteadiness on feet: Secondary | ICD-10-CM | POA: Diagnosis not present

## 2016-01-19 DIAGNOSIS — I2510 Atherosclerotic heart disease of native coronary artery without angina pectoris: Secondary | ICD-10-CM | POA: Diagnosis not present

## 2016-01-19 DIAGNOSIS — I5031 Acute diastolic (congestive) heart failure: Secondary | ICD-10-CM

## 2016-01-19 DIAGNOSIS — I10 Essential (primary) hypertension: Secondary | ICD-10-CM | POA: Diagnosis not present

## 2016-01-19 DIAGNOSIS — E878 Other disorders of electrolyte and fluid balance, not elsewhere classified: Secondary | ICD-10-CM | POA: Diagnosis not present

## 2016-01-19 DIAGNOSIS — N179 Acute kidney failure, unspecified: Secondary | ICD-10-CM | POA: Diagnosis not present

## 2016-01-19 DIAGNOSIS — I495 Sick sinus syndrome: Secondary | ICD-10-CM

## 2016-01-19 LAB — BASIC METABOLIC PANEL
BUN: 54 mg/dL — AB (ref 4–21)
Creatinine: 1.3 mg/dL — AB (ref 0.5–1.1)
Glucose: 79 mg/dL
POTASSIUM: 4.3 mmol/L (ref 3.4–5.3)
SODIUM: 142 mmol/L (ref 137–147)

## 2016-01-19 LAB — CBC AND DIFFERENTIAL
HCT: 41 % (ref 36–46)
HEMOGLOBIN: 13 g/dL (ref 12.0–16.0)
Platelets: 271 10*3/uL (ref 150–399)
WBC: 6.7 10^3/mL

## 2016-01-19 NOTE — Progress Notes (Signed)
LOCATION: [facility]  PCP: Devra Dopp, MD   Code Status: DNR  Goals of care: Advanced Directive information Advanced Directives 01/18/2016  Does Patient Have a Medical Advance Directive? Yes  Type of Advance Directive Out of facility DNR (pink MOST or yellow form)  Does patient want to make changes to medical advance directive? No - Patient declined  Would patient like information on creating a medical advance directive? No - Patient declined       Extended Emergency Contact Information Primary Emergency Contact: Berger,[name] Address: [address]          [place], [place] [address] [place] Home Phone: [phone] Mobile Phone: [phone] Relation: Daughter   Allergies  Allergen Reactions  . Clonidine Derivatives Nausea Only and Anxiety     "feel like a zombie"    Chief Complaint  Patient presents with  . New Admit To SNF    New Admission Visit     HPI:  Patient is a 82 y.o. female seen today for short term rehabilitation post hospital admission from 01/14/16-01/17/16 with acute diastolic chf exacerbation. She responded well to iv diuresis. She was seen by cardiology. She has PMH of complete heart block with sick sinus syndrome s/p pacemaker, ischemic cardiomyopathy, dementia among others. She is seen in her room today.   Review of Systems:  Constitutional: Negative for fever, chills, diaphoresis.  HENT: Negative for headache, congestion, nasal discharge. Eyes: Negative for double vision and discharge.  Respiratory: Negative for shortness of breath and wheezing. Positive for occasional cough.  Cardiovascular: Negative for chest pain, palpitations, leg swelling.  Gastrointestinal: Negative for heartburn, nausea, vomiting, abdominal pain. Had  bowel movement today.  Genitourinary: Negative for dysuria.  Musculoskeletal: Negative for fall in the facility.  Skin: Negative for itching, rash.  Neurological: Negative for  dizziness. Psychiatric/Behavioral: Negative for depression.   Past Medical History:  Diagnosis Date  . Complete heart block (HCC)   . Heart murmur   . Ischemic heart disease   . Left carotid bruit   . Pacemaker - [employer] Judes    VDD Lead    Past Surgical History:  Procedure Laterality Date  . ABDOMINAL HYSTERECTOMY    . EP IMPLANTABLE DEVICE    . MOLE REMOVAL     Social History:   reports that she quit smoking about 22 years ago. She has never used smokeless tobacco. She reports that she does not drink alcohol or use drugs.  Family History  Problem Relation Age of Onset  . Hypertension Mother   . Hypertension Father     Medications:   Medication List       Accurate as of 01/19/16  3:15 PM. Always use your most recent med list.          amLODipine 10 MG tablet Commonly known as:  NORVASC Take 10 mg by mouth daily.   aspirin 81 MG tablet Take 81 mg by mouth daily.   atenolol 100 MG tablet Commonly known as:  TENORMIN Take 50 mg by mouth 2 (two) times daily. Take 1/2 tablet to = 50 mg bid   clopidogrel 75 MG tablet Commonly known as:  PLAVIX Take 75 mg by mouth daily.   donepezil 5 MG tablet Commonly known as:  ARICEPT Take 5 mg by mouth at bedtime.   furosemide 40 MG tablet Commonly known as:  LASIX Take 1 tablet (40 mg total) by mouth daily.   lisinopril 40 MG tablet Commonly known as:  PRINIVIL,ZESTRIL  Take 40 mg by mouth daily.   simvastatin 20 MG tablet Commonly known as:  ZOCOR Take 20 mg by mouth daily.       Immunizations:  There is no immunization history on file for this patient.   Physical Exam:  Vitals:   01/19/16 1511  BP: 117/62  Pulse: 72  Resp: 18  Temp: 97.5 F (36.4 C)  TempSrc: Oral  SpO2: 97%  Weight: 100 lb 11.2 oz (45.7 kg)  Height: 5\' 5"  (1.651 m)   Body mass index is 16.76 kg/m.  General- elderly female, frail and thin built, in no acute distress Head- normocephalic, atraumatic Nose- no nasal  discharge Throat- moist mucus membrane Eyes- PERRLA, EOMI, no pallor, no icterus Neck- no cervical lymphadenopathy Cardiovascular- normal s1,s2, no murmur, no leg edema Respiratory- bilateral clear to auscultation, no wheeze, no rhonchi, no crackles, no use of accessory muscles Abdomen- bowel sounds present, soft, non tender Musculoskeletal- able to move all 4 extremities, generalized weakness, has generalized OA Neurological- alert and oriented to self only Skin- warm and dry Psychiatry- normal mood and affect    Labs reviewed: Basic Metabolic Panel:  Recent Labs  40/98/1110/03/02 0446 01/16/16 0534 01/17/16 0524  NA 141 138 135  K 2.8* 3.9 4.0  CL 99* 98* 96*  CO2 30 32 30  GLUCOSE 97 98 104*  BUN 18 28* 37*  CREATININE 0.82 1.10* 1.19*  CALCIUM 8.7* 8.8* 9.1  MG 1.7  --   --    Liver Function Tests: No results for input(s): AST, ALT, ALKPHOS, BILITOT, PROT, ALBUMIN in the last 8760 hours. No results for input(s): LIPASE, AMYLASE in the last 8760 hours. No results for input(s): AMMONIA in the last 8760 hours. CBC:  Recent Labs  01/14/16 0611  WBC 7.6  HGB 12.4  HCT 38.3  MCV 90.8  PLT 226     Radiological Exams: Dg Chest 2 View  Result Date: 01/14/2016 CLINICAL DATA:  Acute onset of worsening shortness of breath. Decreased O2 saturation. Initial encounter. EXAM: CHEST  2 VIEW COMPARISON:  None. FINDINGS: The lungs are hyperexpanded, with flattening of the hemidiaphragms, compatible with COPD. Bibasilar airspace opacities are noted. Vascular congestion is noted. Increased interstitial markings raise concern for pulmonary edema. Small bilateral pleural effusions are seen. The heart is borderline normal in size. No acute osseous abnormalities are identified. A pacemaker is noted at the right chest wall, with a lead ending at the right ventricle. IMPRESSION: 1. Bibasilar airspace opacities noted. Vascular congestion. Increased interstitial markings raise concern for  pulmonary edema. Small bilateral pleural effusions seen. 2. Findings of COPD. Electronically Signed   By: Roanna RaiderJeffery  Chang M.D.   On: 01/14/2016 06:55    Assessment/Plan  Unsteady gait Will have her work with physical therapy and occupational therapy team to help with gait training and muscle strengthening exercises.fall precautions. Skin care. Encourage to be out of bed.   Acute diastolic CHF S/p iv diuresis. Continue lasix 40 mg daily and check bmp. Continue lisinopril. To follow with cardiology  Electrolyte imbalance Had low k and mg in hospital and was repleted. On laix, check bmp  AKI With diuretics, monitor renal function  CAD S/p CABG. Continue atenolol, lisinopril, aspirin,plavix and statin. Monitor clinically  Sick sinus syndrome S/p pacemaker. Had her pacemaker interrogated in hospital. Monitor clinically  Hypertension Monitor bp reading, check bmp, continue atenolol with lisinopril and amlodipine  Dementia without behavioral disturbance continue Aricept 5 mg daily and monitor. SLP consult.    Goals  of care: short term rehabilitation   Labs/tests ordered: cbc, bmp  Family/ staff Communication: reviewed care plan with patient and nursing supervisor    Oneal GroutMAHIMA Shaleka Brines, MD Internal Medicine [facility] [address] [place], [place] [address] Cell Phone (Monday-Friday 8 am - 5 pm): [phone] On Call: [phone] and follow prompts after 5 pm and on weekends Office Phone: [phone] Office Fax: [phone]

## 2016-01-29 LAB — BASIC METABOLIC PANEL
BUN: 23 mg/dL — AB (ref 4–21)
CREATININE: 0.9 mg/dL (ref 0.5–1.1)
Glucose: 114 mg/dL
POTASSIUM: 3.3 mmol/L — AB (ref 3.4–5.3)
SODIUM: 142 mmol/L (ref 137–147)

## 2016-02-03 ENCOUNTER — Non-Acute Institutional Stay (SKILLED_NURSING_FACILITY): Payer: Medicare Other | Admitting: Adult Health

## 2016-02-03 ENCOUNTER — Encounter: Payer: Self-pay | Admitting: Adult Health

## 2016-02-03 DIAGNOSIS — R531 Weakness: Secondary | ICD-10-CM | POA: Diagnosis not present

## 2016-02-03 DIAGNOSIS — F0390 Unspecified dementia without behavioral disturbance: Secondary | ICD-10-CM

## 2016-02-03 DIAGNOSIS — I10 Essential (primary) hypertension: Secondary | ICD-10-CM

## 2016-02-03 DIAGNOSIS — I480 Paroxysmal atrial fibrillation: Secondary | ICD-10-CM | POA: Diagnosis not present

## 2016-02-03 DIAGNOSIS — I5031 Acute diastolic (congestive) heart failure: Secondary | ICD-10-CM | POA: Diagnosis not present

## 2016-02-03 DIAGNOSIS — I495 Sick sinus syndrome: Secondary | ICD-10-CM

## 2016-02-03 DIAGNOSIS — E876 Hypokalemia: Secondary | ICD-10-CM

## 2016-02-03 DIAGNOSIS — I259 Chronic ischemic heart disease, unspecified: Secondary | ICD-10-CM | POA: Diagnosis not present

## 2016-02-03 NOTE — Progress Notes (Signed)
DATE:    02/03/16  MRN:  [id number]  BIRTHDAY: 07/20/1927  Facility:  Nursing Home Location:  [facility]  Nursing Home Room Number: 106-P  LEVEL OF CARE:  SNF (31)  Contact Information    Name Relation Home Work [place]   Berger,[name] Daughter [phone]  [phone]       Code Status History    Date Active Date Inactive Code Status Order ID Comments User Context   01/14/2016  1:28 PM 01/17/2016  5:14 PM DNR [id number]  Narda Bondsalph A Nettey, MD Inpatient    Questions for Most Recent Historical Code Status (Order [id number])    Question Answer Comment   In the event of cardiac or respiratory ARREST Do not call a "code blue"    In the event of cardiac or respiratory ARREST Do not perform Intubation, CPR, defibrillation or ACLS    In the event of cardiac or respiratory ARREST Use medication by any route, position, wound care, and other measures to relive pain and suffering. May use oxygen, suction and manual treatment of airway obstruction as needed for comfort.         Advance Directive Documentation   Flowsheet Row Most Recent Value  Type of Advance Directive  Out of facility DNR (pink MOST or yellow form)  Pre-existing out of facility DNR order (yellow form or pink MOST form)  No data  "MOST" Form in Place?  No data       Chief Complaint  Patient presents with  . Discharge Note    HISTORY OF PRESENT ILLNESS:  This is an 82 year old female who is for discharge home with home health PT, OT, CNA, Nursing and Social worker.  She has PMH of complete heart block with sick sinus syndrome S/P St. Jude's PPM and ischemic cardiomyopathy S/P PCI (1994). She was hospitalized for acute diastolic CHF with BNP of 580. Cardiology was consulted and she was diuresed then switched to PO Lasix.   Patient was admitted to this facility for short-term rehabilitation after the patient's recent hospitalization.  Patient has completed SNF rehabilitation and therapy has cleared  the patient for discharge.   PAST MEDICAL HISTORY:  Past Medical History:  Diagnosis Date  . Complete heart block (HCC)   . Heart murmur   . Ischemic heart disease   . Left carotid bruit   . Pacemaker - St Judes    VDD Lead      CURRENT MEDICATIONS: Reviewed  Patient's Medications  New Prescriptions   No medications on file  Previous Medications   AMLODIPINE (NORVASC) 10 MG TABLET    Take 10 mg by mouth daily.    ASPIRIN 81 MG TABLET    Take 81 mg by mouth daily.   ATENOLOL (TENORMIN) 100 MG TABLET    Take 50 mg by mouth 2 (two) times daily. Take 1/2 tablet to = 50 mg bid   CLOPIDOGREL (PLAVIX) 75 MG TABLET    Take 75 mg by mouth daily.    DONEPEZIL (ARICEPT) 5 MG TABLET    Take 5 mg by mouth at bedtime.    FUROSEMIDE (LASIX) 20 MG TABLET    Take 20 mg by mouth daily.   LISINOPRIL (PRINIVIL,ZESTRIL) 40 MG TABLET    Take 40 mg by mouth daily.    POTASSIUM CHLORIDE (KLOR-CON) 20 MEQ PACKET    Take 20 mEq by mouth. Give 1 tablet on M-W-F   SIMVASTATIN (ZOCOR) 20 MG TABLET    Take 20 mg  by mouth daily.   Modified Medications   No medications on file  Discontinued Medications   FUROSEMIDE (LASIX) 40 MG TABLET    Take 1 tablet (40 mg total) by mouth daily.     Allergies  Allergen Reactions  . Clonidine Derivatives Nausea Only and Anxiety     "feel like a zombie"     REVIEW OF SYSTEMS:  GENERAL: no change in appetite, no fatigue, no weight changes, no fever, chills or weakness EYES: Denies change in vision, dry eyes, eye pain, itching or discharge EARS: Denies change in hearing, ringing in ears, or earache NOSE: Denies nasal congestion or epistaxis MOUTH and THROAT: Denies oral discomfort, gingival pain or bleeding, pain from teeth or hoarseness   RESPIRATORY: no cough, SOB, DOE, wheezing, hemoptysis CARDIAC: no chest pain, edema or palpitations GI: no abdominal pain, diarrhea, constipation, heart burn, nausea or vomiting GU: Denies dysuria, frequency, hematuria,  incontinence, or discharge PSYCHIATRIC: Denies feeling of depression or anxiety. No report of hallucinations, insomnia, paranoia, or agitation    PHYSICAL EXAMINATION  GENERAL APPEARANCE:  In no acute distress.  SKIN:  Skin is warm and dry.  HEAD: Normal in size and contour. No evidence of trauma EYES: Lids open and close normally. No blepharitis, entropion or ectropion. PERRL. Conjunctivae are clear and sclerae are white. Lenses are without opacity EARS: Pinnae are normal. Patient hears normal voice tunes of the examiner MOUTH and THROAT: Lips are without lesions. Oral mucosa is moist and without lesions. Tongue is normal in shape, size, and color and without lesions NECK: supple, trachea midline, no neck masses, no thyroid tenderness, no thyromegaly LYMPHATICS: no LAN in the neck, no supraclavicular LAN RESPIRATORY: breathing is even & unlabored, BS CTAB CARDIAC: RRR, no murmur,no extra heart sounds, BLE trace edema, right chest pacemaker GI: abdomen soft, normal BS, no masses, no tenderness, no hepatomegaly, no splenomegaly EXTREMITIES:  Able to move 4 extremities PSYCHIATRIC: Alert to person, disoriented to time and place. Affect and behavior are appropriate  LABS/RADIOLOGY: Labs reviewed: Basic Metabolic Panel:  Recent Labs  16/10/96 0446 01/16/16 0534 01/17/16 0524 01/19/16 01/29/16  NA 141 138 135 142 142  K 2.8* 3.9 4.0 4.3 3.3*  CL 99* 98* 96*  --   --   CO2 30 32 30  --   --   GLUCOSE 97 98 104*  --   --   BUN 18 28* 37* 54* 23*  CREATININE 0.82 1.10* 1.19* 1.3* 0.9  CALCIUM 8.7* 8.8* 9.1  --   --   MG 1.7  --   --   --   --    CBC:  Recent Labs  01/14/16 0611 01/19/16  WBC 7.6 6.7  HGB 12.4 13.0  HCT 38.3 41  MCV 90.8  --   PLT 226 271      ASSESSMENT/PLAN:  Generalized weakness - for Home health CNA, nursing, SW, PT and OT, for therapeutic strengthening exercises; fall precaution  Acute diastolic CHF   - EF normal, moderate MR ; cardiology was  consulted, continue Lasix 40 mg PO daily, continue aspirin 81 mg by mouth daily, Plavix 75 mg 1 tab by mouth daily and lisinopril 40 mg 1 tab by mouth daily; follow-up with cardiology   PAF - has pacemaker checked; continue atenolol 50 mg 1 tab by mouth twice a day  AKI - secondary to diuretic; improved Lab Results  Component Value Date   CREATININE 0.9 01/29/2016   History of ischemic cardiomyopathy - EF  normal; continue aspirin 81 mg 1 tab by mouth daily, Plavix 75 mg 1 tab by mouth daily, simvastatin 20 mg 1 tab by mouth daily and atenolol 50 mg 1 tab by mouth twice a day  SSS - pacemaker was interrogated  Dementia with behavioral disturbance - continue Aricept 5 mg 1 tab by mouth daily at bedtime  Hypertension - continue Norvasc 10 mg 1 tab by mouth daily, atenolol 100 mg 1 tab by mouth twice a day and lisinopril 40 mg 1 tab by mouth daily  Hypokalemia - continue KCl ER 20 meq 1 tab by mouth every Mondays-Wednesdays-Fridays     I have filled out patient's discharge paperwork and written prescriptions.  Patient will receive home health PT, OT, SW, Nursing and CNA.  DME provided:  None  Total discharge time: Greater than 30 minutes Greater than 50% was spent in counseling and coordination of care with the patient.   Discharge time involved coordination of the discharge process with social worker, nursing staff and therapy department. Medical justification for home health services verified.     Kenard GowerMonina Medina-Vargas - NP [facility] [phone]

## 2016-02-27 ENCOUNTER — Other Ambulatory Visit: Payer: Self-pay | Admitting: Adult Health

## 2016-03-09 ENCOUNTER — Encounter: Payer: Medicare Other | Admitting: Cardiovascular Disease

## 2016-03-11 ENCOUNTER — Other Ambulatory Visit: Payer: Self-pay | Admitting: Internal Medicine

## 2016-03-14 ENCOUNTER — Ambulatory Visit (INDEPENDENT_AMBULATORY_CARE_PROVIDER_SITE_OTHER): Payer: Medicare Other | Admitting: *Deleted

## 2016-03-14 ENCOUNTER — Ambulatory Visit (INDEPENDENT_AMBULATORY_CARE_PROVIDER_SITE_OTHER): Payer: Medicare Other | Admitting: Cardiovascular Disease

## 2016-03-14 ENCOUNTER — Encounter: Payer: Self-pay | Admitting: Cardiovascular Disease

## 2016-03-14 VITALS — BP 130/70 | HR 80 | Ht 63.0 in | Wt 105.0 lb

## 2016-03-14 DIAGNOSIS — Z950 Presence of cardiac pacemaker: Secondary | ICD-10-CM

## 2016-03-14 DIAGNOSIS — I5043 Acute on chronic combined systolic (congestive) and diastolic (congestive) heart failure: Secondary | ICD-10-CM

## 2016-03-14 DIAGNOSIS — I495 Sick sinus syndrome: Secondary | ICD-10-CM

## 2016-03-14 DIAGNOSIS — I5032 Chronic diastolic (congestive) heart failure: Secondary | ICD-10-CM

## 2016-03-14 DIAGNOSIS — I442 Atrioventricular block, complete: Secondary | ICD-10-CM

## 2016-03-14 LAB — CUP PACEART INCLINIC DEVICE CHECK
Battery Impedance: 13500 Ohm
Battery Remaining Longevity: 6 mo
Date Time Interrogation Session: 20180129150250
Implantable Lead Model: 1368
Implantable Pulse Generator Implant Date: 20071002
Lead Channel Impedance Value: 438 Ohm
Lead Channel Pacing Threshold Amplitude: 1.25 V
Lead Channel Pacing Threshold Pulse Width: 0.5 ms
Lead Channel Sensing Intrinsic Amplitude: 1 mV
Lead Channel Setting Sensing Sensitivity: 4 mV
MDC IDC LEAD IMPLANT DT: 20000201
MDC IDC LEAD LOCATION: 753860
MDC IDC MSMT BATTERY VOLTAGE: 2.65 V
MDC IDC SET LEADCHNL RV PACING AMPLITUDE: 2.5 V
MDC IDC SET LEADCHNL RV PACING PULSEWIDTH: 0.5 ms
MDC IDC STAT BRADY RV PERCENT PACED: 99 % — AB
Pulse Gen Model: 5826
Pulse Gen Serial Number: 1785931

## 2016-03-14 MED ORDER — POTASSIUM CHLORIDE ER 10 MEQ PO TBCR: 10.0000 meq | EXTENDED_RELEASE_TABLET | Freq: Every day | ORAL | 3 refills | Status: DC

## 2016-03-14 MED ORDER — FUROSEMIDE 20 MG PO TABS: 20.0000 mg | ORAL_TABLET | Freq: Every day | ORAL | 3 refills | Status: DC

## 2016-03-14 NOTE — Patient Instructions (Addendum)
Medication Instructions:  RESTART Kdur (potassium) 10 meq once daily and Lasix (furosemide) 20 mg once daily  Labwork: TODAY - basic metabolic panel  Your physician recommends that you return for lab work in: 2-3 weeks for basic metabolic panel   Testing/Procedures: None Ordered   Follow-Up: Your physician recommends that you schedule a follow-up appointment in: 3 months with Dr. Elease HashimotoNahser   If you need a refill on your cardiac medications before your next appointment, please call your pharmacy.   Thank you for choosing [facility]! Eligha BridegroomMichelle Marleen Moret, RN [phone]

## 2016-03-14 NOTE — Progress Notes (Signed)
Pacemaker check in clinic. Normal device function. Thresholds, sensing, impedances consistent with previous measurements. Device programmed to maximize longevity. 23 mode switches (<1%), longest 2min 14sec, most AT per rates. No episode triggers enabled. Device programmed at appropriate safety margins; RV output increased to 2.5V. Histogram distribution indicates As HR binning in 100-110bpm range ~12% of the time. Frequent brief runs of ST noted during appointment, patient asymptomatic. ERI as of 03/13/16. Patient education completed. ROV with RU on 03/28/16 to discuss generator change.

## 2016-03-14 NOTE — Progress Notes (Signed)
Cardiology Office Note   Date:  03/14/2016   ID:  Kathy Solomon, DOB 07-09-1927, MRN [id number]  PCP:  Devra DoppHOWELL, TAMIEKA, MD  Cardiologist: Cassell Clementhomas Brackbill MD -->   Nahser   Chief Complaint  Patient presents with  . Hypertension      History of Present Illness: Kathy Solomon is a 83 y.o. female who presents for scheduled 6 month follow-up visit  This pleasant 83 year old woman was initially seen at the request of [facility] to establish cardiology care here in the [place] area. The patient moved to [place] several years ago from [place]. She initially received her cardiology care at [facility] but because of the distance involved wishes to transfer her to [place]. [place] The patient has a history of ischemic heart disease. She had a myocardial infarction in 1994 treated with PCI in [facility] [place] [facility]. She has subsequently been on aspirin and Plavix. She has a past history of sick sinus syndrome and has a St. Jude dual-chamber pacemaker implanted in [place] on 03/17/1998. She has had her battery changed once since then.Her pacemaker is followed by Dr. Graciela HusbandsKlein. She has a past history of a known systolic murmur secondary to mild aortic valve disease. She also has a past history of a known left carotid bruit previous carotid duplex in 2012 showing no significant stenosis. She has not been having any TIA symptoms. The patient has not been experiencing any recent cardiac symptoms. She denies any chest pain or shortness of breath. She walks for 30 minutes daily with a neighbor friend in the neighborhood or exercise. She has occasional ankle edema. She has not been having a dizziness or syncope. She has not been having any racing of her heart or tachycardia. She has had no TIA symptoms. Her appetite is good and her weight has been stable. She quit drinking  alcohol 25 years ago. She quit smoking many years ago when she had her heart attack. Social history reveals that she moved to [place] to be near her daughter.Her daughter drives her to her appointments. Her husband died of leukemia about 4 years ago. Since last visit she had carotid Dopplers done on 11/12/14 which showed less than 39% stenosis bilaterally.  A repeat study in 2 years was recommended.  She has not been experiencing any TIA symptoms..  Jan. 29, 2018:  Kathy Solomon is seen for the first time today. She is a transfer from Dr. Patty SermonsBrackbill.   Seen with daughter , Gaylyn RongKris  In November, she was admitted with increased dyspnea.   Was thought to to be CHF .  Was put on Lasix Having trouble getting someone to prescribe the K-dur   Past Medical History:  Diagnosis Date  . Complete heart block (HCC)   . Heart murmur   . Ischemic heart disease   . Left carotid bruit   . Pacemaker - St Judes    VDD Lead     Past Surgical History:  Procedure Laterality Date  . ABDOMINAL HYSTERECTOMY    . EP IMPLANTABLE DEVICE    . MOLE REMOVAL       Current Outpatient Prescriptions  Medication Sig Dispense Refill  . amLODipine (NORVASC) 10 MG tablet Take 10 mg by mouth daily.     Marland Kitchen. aspirin 81 MG tablet Take 81 mg by mouth daily.    Marland Kitchen. atenolol (TENORMIN) 100 MG tablet Take 50 mg by mouth 2 (two)  times daily. Take 1/2 tablet to = 50 mg bid    . clopidogrel (PLAVIX) 75 MG tablet Take 75 mg by mouth daily.     Marland Kitchen donepezil (ARICEPT) 5 MG tablet Take 5 mg by mouth at bedtime.     . furosemide (LASIX) 40 MG tablet Take 40 mg by mouth daily.     . furosemide (LASIX) 40 MG tablet Take 40 mg by mouth daily.    Marland Kitchen lisinopril (PRINIVIL,ZESTRIL) 40 MG tablet Take 40 mg by mouth daily.     . potassium chloride (KLOR-CON) 20 MEQ packet Take 20 mEq by mouth. Give 1 tablet on M-W-F    . simvastatin (ZOCOR) 20 MG tablet Take 20 mg by mouth daily.      No current facility-administered medications for this visit.      Allergies:   Clonidine derivatives    Social History:  The patient  reports that she quit smoking about 22 years ago. She has never used smokeless tobacco. She reports that she does not drink alcohol or use drugs.   Family History:  The patient's family history includes Hypertension in her father and mother.    ROS:  Please see the history of present illness.   Otherwise, review of systems are positive for none.   All other systems are reviewed and negative.    PHYSICAL EXAM: VS:  BP 130/70 (BP Location: Left Arm, Patient Position: Sitting, Cuff Size: Normal)   Pulse 80   Ht 5\' 3"  (1.6 m)   Wt 105 lb (47.6 kg)   BMI 18.60 kg/m  , BMI Body mass index is 18.6 kg/m. GEN: Well nourished, well developed, in no acute distress  HEENT: normal  Neck: no JVD, there is a soft carotid bruit on the right and a moderate carotid bruit on the left., or masses Cardiac: RRR; there is a grade 2/6 systolic ejection murmur at the aortic area.  No diastolic murmur.  No, rubs, or gallops,no edema  Respiratory:  clear to auscultation bilaterally, normal work of breathing GI: soft, nontender, nondistended, + BS MS: no deformity or atrophy  Skin: warm and dry, no rash Neuro:  Strength and sensation are intact Psych: euthymic mood, full affect   EKG:  EKG is ordered today. The ekg ordered today demonstrates a paced ventricular rhythm at 72.      Recent Labs: 01/14/2016: B Natriuretic Peptide 580.1 01/15/2016: Magnesium 1.7; TSH 3.126 01/19/2016: Hemoglobin 13.0; Platelets 271 01/29/2016: BUN 23; Creatinine 0.9; Potassium 3.3; Sodium 142    Lipid Panel No results found for: CHOL, TRIG, HDL, CHOLHDL, VLDL, LDLCALC, LDLDIRECT    Wt Readings from Last 3 Encounters:  03/14/16 105 lb (47.6 kg)  02/03/16 100 lb 11.2 oz (45.7 kg)  01/19/16 100 lb 11.2 oz (45.7 kg)        ASSESSMENT AND PLAN:  1.   Acute on Chronic diastolic CHF:  She seems to be doing very well. She has continued to take  the Lasix but apparently there has been some confusion in getting her potassium refilled. We will refill her Lasix at 20 mg a day and her potassium chloride at 10 mEq a day. We'll check a basic medical profile today and again in 2-3 weeks.   2.    ischemic heart disease status post acute myocardial infarction with PCI in 1994. No subsequent angina.  3. asymptomatic bilateral carotid bruits  4. history of sick sinus syndrome with functioning dual-chamber pacemaker (St. Jude) implanted in 2000. Likely need  to have her pacer generator changed soon.  5. murmur of aortic stenosis, asymptomatic  Current medicines are reviewed at length with the patient today.  The patient does not have concerns regarding medicines.  The following changes have been made:  no change  Labs/ tests ordered today include:   No orders of the defined types were placed in this encounter.    Kristeen Miss, MD  03/14/2016 5:21 PM    [facility] [address] [place],  [address] [place], [place]  [address] Pager [phone] Phone: [phone]; Fax: [phone]

## 2016-03-15 LAB — BASIC METABOLIC PANEL
BUN / CREAT RATIO: 15 (ref 12–28)
BUN: 13 mg/dL (ref 8–27)
CHLORIDE: 95 mmol/L — AB (ref 96–106)
CO2: 25 mmol/L (ref 18–29)
Calcium: 9.6 mg/dL (ref 8.7–10.3)
Creatinine, Ser: 0.85 mg/dL (ref 0.57–1.00)
GFR calc non Af Amer: 62 mL/min/{1.73_m2} (ref >59)
GFR, EST AFRICAN AMERICAN: 72 mL/min/{1.73_m2} (ref >59)
Glucose: 99 mg/dL (ref 65–99)
POTASSIUM: 3.7 mmol/L (ref 3.5–5.2)
Sodium: 140 mmol/L (ref 134–144)

## 2016-03-27 NOTE — Progress Notes (Signed)
Cardiology Office Note Date:  03/28/2016  Patient ID:  Kathy Solomon, DOB 1927/09/22, MRN [id number] PCP:  Devra Dopp, MD  Cardiologist:  Dr. Elease Hashimoto Electrophysiologist: Dr. Graciela Husbands   Chief Complaint: pacer at ERI  History of Present Illness: Kathy Solomon is a 83 y.o. female with history of CAD, CHB w/PPM, comes to the office to be seen for Dr. Graciela Husbands.  Her PPm was noted to have reached ERI on 03/13/16 by her last remote transmisson.  She comes today accompanied by her daughter though lives alone and cares for herself.  She feels well, denies any kind of CP, palpitations or SOB, no dizziness, near syncope or syncope.  She remains active caring for her home, does errands with her daughter.  Device information SJM single lead (VDD lead) chamber PPM, original implant 2000, gen change  (done in [place]) PACER DEPENDENT  Past Medical History:  Diagnosis Date  . Complete heart block (HCC)   . Heart murmur   . Ischemic heart disease   . Left carotid bruit   . Pacemaker - St Judes    VDD Lead     Past Surgical History:  Procedure Laterality Date  . ABDOMINAL HYSTERECTOMY    . EP IMPLANTABLE DEVICE    . MOLE REMOVAL      Current Outpatient Prescriptions  Medication Sig Dispense Refill  . amLODipine (NORVASC) 10 MG tablet Take 10 mg by mouth daily.     Marland Kitchen aspirin 81 MG tablet Take 81 mg by mouth daily.    Marland Kitchen atenolol (TENORMIN) 100 MG tablet Take 50 mg by mouth 2 (two) times daily. Take 1/2 tablet to = 50 mg bid    . clopidogrel (PLAVIX) 75 MG tablet Take 75 mg by mouth daily.     Marland Kitchen donepezil (ARICEPT) 5 MG tablet Take 5 mg by mouth at bedtime.     . furosemide (LASIX) 20 MG tablet Take 1 tablet (20 mg total) by mouth daily. 90 tablet 3  . lisinopril (PRINIVIL,ZESTRIL) 40 MG tablet Take 40 mg by mouth daily.     . potassium chloride (K-DUR) 10 MEQ tablet Take 1 tablet (10 mEq total) by mouth daily. 90 tablet 3  . simvastatin (ZOCOR) 20 MG tablet Take 20 mg by mouth daily.       No current facility-administered medications for this visit.     Allergies:   Clonidine derivatives   Social History:  The patient  reports that she quit smoking about 22 years ago. She has never used smokeless tobacco. She reports that she does not drink alcohol or use drugs.   Family History:  The patient's family history includes Hypertension in her father and mother.  ROS:  Please see the history of present illness.  All other systems are reviewed and otherwise negative.   PHYSICAL EXAM:  VS:  BP 130/72   Pulse 68   Ht 5\' 2"  (1.575 m)   Wt 104 lb (47.2 kg)   BMI 19.02 kg/m  BMI: Body mass index is 19.02 kg/m. Well nourished, well developed, in no acute distress  HEENT: normocephalic, atraumatic  Neck: no JVD, carotid bruits or masses Cardiac:  RRR; 1/6 SM, no rubs, or gallops Lungs:  CTA b/l, no wheezing, rhonchi or rales  Abd: soft, nontender MS: no deformity or atrophy Ext:  trace edema b/l LE Skin: warm and dry, no rash Neuro:  No gross deficits appreciated Psych: euthymic mood, full affect  PPM site is stable, no tethering or discomfort (right side)  EKG:  Done today shows SR, PAC's V paced, baseline artifact, appears some are V paced without P wave PPM interrogation done today and reviewed by myself: ERI reached 03/13/16, programmed VDD 60bpm, she is pacer dependent, lead measurements are stable  01/15/16: TTE Study Conclusions - Left ventricle: The cavity size was normal. Systolic function was   normal. The estimated ejection fraction was in the range of 60%   to 65%. Wall motion was normal; there were no regional wall   motion abnormalities. - Aortic valve: Trileaflet; mildly thickened, mildly calcified   leaflets. - Mitral valve: Moderately to severely calcified annulus. There was   moderate regurgitation. - Tricuspid valve: There was mild regurgitation. - Pulmonary arteries: Systolic pressure was mildly increased. PA   peak pressure: 41 mm Hg  (S).  Recent Labs: 01/14/2016: B Natriuretic Peptide 580.1 01/15/2016: Magnesium 1.7; TSH 3.126 01/19/2016: Hemoglobin 13.0; Platelets 271 03/14/2016: BUN 13; Creatinine, Ser 0.85; Potassium 3.7; Sodium 140  No results found for requested labs within last 8760 hours.   Estimated Creatinine Clearance: 35.4 mL/min (by C-G formula based on SCr of 0.85 mg/dL).   Wt Readings from Last 3 Encounters:  03/28/16 104 lb (47.2 kg)  03/14/16 105 lb (47.6 kg)  02/03/16 100 lb 11.2 oz (45.7 kg)     Other studies reviewed: Additional studies/records reviewed today include: summarized above  ASSESSMENT AND PLAN:  1. CHB w/PPM     ERI reached     risks/benefits of generator change procedure discussed, she would like to proceed.     The daughter mentions that after a mole removal the patient once home had bleeding that required medical intervention to stop, she has not had any cardiac procedures or interventions for years, will have her hold her plavix the day prior and of her gen change.  2. CAD     no CP     C/w Dr. Elease HashimotoNahser   Disposition: F/u with generator change with Dr. Graciela HusbandsKlein and usual post procedure follow up/wound check.  Current medicines are reviewed at length with the patient today.  The patient did not have any concerns regarding medicines.  Judith BlonderSigned, Renee Ursy, PA-C 03/28/2016 2:20 PM     [facility] [address] [place] [address] [phone] (office)  [phone] (fax)

## 2016-03-28 ENCOUNTER — Other Ambulatory Visit: Payer: Medicare Other | Admitting: *Deleted

## 2016-03-28 ENCOUNTER — Encounter: Payer: Self-pay | Admitting: *Deleted

## 2016-03-28 ENCOUNTER — Other Ambulatory Visit: Payer: Self-pay | Admitting: Adult Health

## 2016-03-28 ENCOUNTER — Ambulatory Visit (INDEPENDENT_AMBULATORY_CARE_PROVIDER_SITE_OTHER): Payer: Medicare Other | Admitting: Physician Assistant

## 2016-03-28 ENCOUNTER — Encounter (INDEPENDENT_AMBULATORY_CARE_PROVIDER_SITE_OTHER): Payer: Self-pay

## 2016-03-28 VITALS — BP 130/72 | HR 68 | Ht 62.0 in | Wt 104.0 lb

## 2016-03-28 DIAGNOSIS — I5032 Chronic diastolic (congestive) heart failure: Secondary | ICD-10-CM

## 2016-03-28 DIAGNOSIS — Z950 Presence of cardiac pacemaker: Secondary | ICD-10-CM

## 2016-03-28 DIAGNOSIS — I442 Atrioventricular block, complete: Secondary | ICD-10-CM | POA: Diagnosis not present

## 2016-03-28 DIAGNOSIS — I5043 Acute on chronic combined systolic (congestive) and diastolic (congestive) heart failure: Secondary | ICD-10-CM | POA: Diagnosis not present

## 2016-03-28 DIAGNOSIS — I2510 Atherosclerotic heart disease of native coronary artery without angina pectoris: Secondary | ICD-10-CM

## 2016-03-28 DIAGNOSIS — Z01818 Encounter for other preprocedural examination: Secondary | ICD-10-CM | POA: Diagnosis not present

## 2016-03-28 NOTE — Patient Instructions (Addendum)
Medication Instructions:   Your physician recommends that you continue on your current medications as directed. Please refer to the Current Medication list given to you today.   If you need a refill on your cardiac medications before your next appointment, please call your pharmacy.  Labwork: RETURN FOR LABS CBC AND BMET  ON 04/28/16.Marland Kitchen.   Testing/Procedures: SEE LETTER FOR INSTRUCTIONS FOR GEN CHANGE ON 05/04/16    Follow-Up:  AFTER 05/04/2016   10 DAY WOUND CHECK WITH DEVICE CLINIC  90  DAY PACER PHYS PACER  CHK. WITH DR Graciela HusbandsKLEIN    Any Other Special Instructions Will Be Listed Below (If Applicable).

## 2016-03-29 LAB — BASIC METABOLIC PANEL
BUN / CREAT RATIO: 20 (ref 12–28)
BUN: 20 mg/dL (ref 8–27)
CO2: 28 mmol/L (ref 18–29)
CREATININE: 1.01 mg/dL — AB (ref 0.57–1.00)
Calcium: 9.8 mg/dL (ref 8.7–10.3)
Chloride: 99 mmol/L (ref 96–106)
GFR calc Af Amer: 58 mL/min/{1.73_m2} — ABNORMAL LOW (ref >59)
GFR calc non Af Amer: 51 mL/min/{1.73_m2} — ABNORMAL LOW (ref >59)
GLUCOSE: 94 mg/dL (ref 65–99)
POTASSIUM: 4 mmol/L (ref 3.5–5.2)
SODIUM: 142 mmol/L (ref 134–144)

## 2016-04-12 ENCOUNTER — Encounter: Payer: Self-pay | Admitting: *Deleted

## 2016-04-12 ENCOUNTER — Telehealth: Payer: Self-pay | Admitting: *Deleted

## 2016-04-12 NOTE — Telephone Encounter (Signed)
SPOKE TO DAUGHTER ABOUT PROCEDURE DATE CHANGES  FROM 05-04-16  TO 05-11-16 ALONG WITH LAB SCHEDULED  TIME FROM 04-28-16 TO 05-05-16.  DAUGHTER WAS AGREEABLE WITH ALL CHANGES.  DAUGHTER  ADDRESS WAS COLLECTED WAS TOLD WILL GET NEW LETTER MAILED TO HOME WITH CHANGES.

## 2016-04-28 ENCOUNTER — Other Ambulatory Visit: Payer: Medicare Other

## 2016-05-05 ENCOUNTER — Other Ambulatory Visit: Payer: Self-pay | Admitting: Physician Assistant

## 2016-05-05 ENCOUNTER — Other Ambulatory Visit: Payer: Medicare Other | Admitting: *Deleted

## 2016-05-05 DIAGNOSIS — Z01818 Encounter for other preprocedural examination: Secondary | ICD-10-CM

## 2016-05-05 LAB — CBC
HEMATOCRIT: 43.6 % (ref 34.0–46.6)
HEMOGLOBIN: 14.6 g/dL (ref 11.1–15.9)
MCH: 29.7 pg (ref 26.6–33.0)
MCHC: 33.5 g/dL (ref 31.5–35.7)
MCV: 89 fL (ref 79–97)
Platelets: 204 10*3/uL (ref 150–379)
RBC: 4.91 x10E6/uL (ref 3.77–5.28)
RDW: 13.9 % (ref 12.3–15.4)
WBC: 6.3 10*3/uL (ref 3.4–10.8)

## 2016-05-05 LAB — BASIC METABOLIC PANEL
BUN / CREAT RATIO: 17 (ref 12–28)
BUN: 19 mg/dL (ref 8–27)
CO2: 22 mmol/L (ref 18–29)
CREATININE: 1.09 mg/dL — AB (ref 0.57–1.00)
Calcium: 9.8 mg/dL (ref 8.7–10.3)
Chloride: 98 mmol/L (ref 96–106)
GFR calc Af Amer: 53 mL/min/{1.73_m2} — ABNORMAL LOW (ref >59)
GFR, EST NON AFRICAN AMERICAN: 46 mL/min/{1.73_m2} — AB (ref >59)
Glucose: 97 mg/dL (ref 65–99)
Potassium: 4.1 mmol/L (ref 3.5–5.2)
SODIUM: 141 mmol/L (ref 134–144)

## 2016-05-09 ENCOUNTER — Telehealth: Payer: Self-pay | Admitting: Internal Medicine

## 2016-05-09 NOTE — Telephone Encounter (Signed)
Received a call from patient's daughter Ashby DawesKris Berger.She was calling to make sure clopidogrel is same as plavix.Advised clopidogrel is generic plavix.Advised she is to hold day before and morning of procedure.

## 2016-05-09 NOTE — Telephone Encounter (Signed)
New message   Pt daughter is calling to ask if her clopidogrel is what she is supposed to hold before her procedure.

## 2016-05-11 ENCOUNTER — Ambulatory Visit (HOSPITAL_COMMUNITY)
Admission: RE | Admit: 2016-05-11 | Discharge: 2016-05-11 | Disposition: A | Discharge status: Home / Self Care | Payer: Medicare Other | Source: Ambulatory Visit | Attending: Internal Medicine | Admitting: Internal Medicine

## 2016-05-11 ENCOUNTER — Encounter (HOSPITAL_COMMUNITY)
Admission: RE | Disposition: A | Discharge status: Home / Self Care | Payer: Self-pay | Source: Ambulatory Visit | Attending: Internal Medicine

## 2016-05-11 DIAGNOSIS — Z7982 Long term (current) use of aspirin: Secondary | ICD-10-CM | POA: Insufficient documentation

## 2016-05-11 DIAGNOSIS — Z45010 Encounter for checking and testing of cardiac pacemaker pulse generator [battery]: Secondary | ICD-10-CM | POA: Insufficient documentation

## 2016-05-11 DIAGNOSIS — I358 Other nonrheumatic aortic valve disorders: Secondary | ICD-10-CM | POA: Diagnosis not present

## 2016-05-11 DIAGNOSIS — Z7902 Long term (current) use of antithrombotics/antiplatelets: Secondary | ICD-10-CM | POA: Insufficient documentation

## 2016-05-11 DIAGNOSIS — I2510 Atherosclerotic heart disease of native coronary artery without angina pectoris: Secondary | ICD-10-CM | POA: Insufficient documentation

## 2016-05-11 DIAGNOSIS — Z87891 Personal history of nicotine dependence: Secondary | ICD-10-CM | POA: Diagnosis not present

## 2016-05-11 DIAGNOSIS — I110 Hypertensive heart disease with heart failure: Secondary | ICD-10-CM | POA: Insufficient documentation

## 2016-05-11 DIAGNOSIS — I442 Atrioventricular block, complete: Secondary | ICD-10-CM | POA: Diagnosis not present

## 2016-05-11 DIAGNOSIS — I252 Old myocardial infarction: Secondary | ICD-10-CM | POA: Insufficient documentation

## 2016-05-11 DIAGNOSIS — I509 Heart failure, unspecified: Secondary | ICD-10-CM | POA: Insufficient documentation

## 2016-05-11 DIAGNOSIS — Z8249 Family history of ischemic heart disease and other diseases of the circulatory system: Secondary | ICD-10-CM | POA: Insufficient documentation

## 2016-05-11 DIAGNOSIS — Z950 Presence of cardiac pacemaker: Secondary | ICD-10-CM | POA: Diagnosis present

## 2016-05-11 HISTORY — PX: PPM GENERATOR CHANGEOUT: EP1233

## 2016-05-11 LAB — SURGICAL PCR SCREEN
MRSA, PCR: NEGATIVE
Staphylococcus aureus: POSITIVE — AB

## 2016-05-11 SURGERY — PPM GENERATOR CHANGEOUT

## 2016-05-11 MED ORDER — MUPIROCIN 2 % EX OINT: 1.0000 "application " | TOPICAL_OINTMENT | Freq: Once | CUTANEOUS | Status: DC

## 2016-05-11 MED ORDER — CHLORHEXIDINE GLUCONATE 4 % EX LIQD
60.0000 mL | Freq: Once | CUTANEOUS | Status: DC
  Filled 2016-05-11: qty 60

## 2016-05-11 MED ORDER — MIDAZOLAM HCL 5 MG/5ML IJ SOLN
INTRAMUSCULAR | Status: AC
  Filled 2016-05-11: qty 5

## 2016-05-11 MED ORDER — CEFAZOLIN SODIUM-DEXTROSE 2-4 GM/100ML-% IV SOLN
2.0000 g | INTRAVENOUS | Status: AC
  Administered 2016-05-11: 2 g via INTRAVENOUS
  Filled 2016-05-11: qty 100

## 2016-05-11 MED ORDER — LIDOCAINE HCL (PF) 1 % IJ SOLN
INTRAMUSCULAR | Status: DC | PRN
Start: 2016-05-11 — End: 2016-05-11
  Administered 2016-05-11: 35 mL via INTRADERMAL

## 2016-05-11 MED ORDER — CEFAZOLIN SODIUM-DEXTROSE 2-4 GM/100ML-% IV SOLN
INTRAVENOUS | Status: AC
  Filled 2016-05-11: qty 100

## 2016-05-11 MED ORDER — MUPIROCIN 2 % EX OINT
TOPICAL_OINTMENT | CUTANEOUS | Status: AC
  Administered 2016-05-11: 12:00:00
  Filled 2016-05-11: qty 22

## 2016-05-11 MED ORDER — SODIUM CHLORIDE 0.9 % IR SOLN
Status: AC
  Filled 2016-05-11: qty 2

## 2016-05-11 MED ORDER — LIDOCAINE HCL (PF) 1 % IJ SOLN
INTRAMUSCULAR | Status: AC
  Filled 2016-05-11: qty 60

## 2016-05-11 MED ORDER — SODIUM CHLORIDE 0.9 % IR SOLN
80.0000 mg | Status: AC
  Administered 2016-05-11: 80 mg
  Filled 2016-05-11: qty 2

## 2016-05-11 MED ORDER — FENTANYL CITRATE (PF) 100 MCG/2ML IJ SOLN
INTRAMUSCULAR | Status: AC
Start: 2016-05-11 — End: ?
  Filled 2016-05-11: qty 2

## 2016-05-11 MED ORDER — SODIUM CHLORIDE 0.9 % IV SOLN
INTRAVENOUS | Status: DC
  Administered 2016-05-11: 13:00:00 via INTRAVENOUS

## 2016-05-11 SURGICAL SUPPLY — 6 items
CABLE SURGICAL S-101-97-12 (CABLE) ×3 IMPLANT
DEVICE DISSECT PLASMABLAD 3.0S (MISCELLANEOUS) ×1 IMPLANT
PACEMAKER ASSURITY DR-RF (Pacemaker) ×3 IMPLANT
PAD DEFIB LIFELINK (PAD) ×3 IMPLANT
PLASMABLADE 3.0S (MISCELLANEOUS) ×3
TRAY PACEMAKER INSERTION (PACKS) ×3 IMPLANT

## 2016-05-11 NOTE — H&P (Signed)
Patient Care Team: Devra Doppamieka Howell, MD as PCP - General (Family Medicine)   HPI  Kathy BravoMarion Solomon is a 83 y.o. female Admitted for device generator replacement  Initially, device implanted in [place] for complete hart block in 2000 with one interval generator replacement. Device is VDD single lead system   She has a history of coronary artery disease with prior MI [facility] with [facility] in [place]. She has known mild aortic valve disease.  Hospitalized CHF fall 2017   Echo >>EF 65% moderate MR  BNP elevated but other labs ok incl Tn  Tele nonsustained atrial tach  Records and Results Reviewed   Past Medical History:  Diagnosis Date  . Complete heart block (HCC)   . Heart murmur   . Ischemic heart disease   . Left carotid bruit   . Pacemaker - St Judes    VDD Lead     Past Surgical History:  Procedure Laterality Date  . ABDOMINAL HYSTERECTOMY    . EP IMPLANTABLE DEVICE    . MOLE REMOVAL      No current facility-administered medications for this encounter.     Allergies  Allergen Reactions  . Clonidine Derivatives Nausea Only, Anxiety and Other (See Comments)     "feel like a zombie"      Social History  Substance Use Topics  . Smoking status: Former Smoker    Quit date: 05/16/1993  . Smokeless tobacco: Never Used  . Alcohol use No     Family History  Problem Relation Age of Onset  . Hypertension Mother   . Hypertension Father      No current facility-administered medications on file prior to encounter.    Current Outpatient Prescriptions on File Prior to Encounter  Medication Sig Dispense Refill  . amLODipine (NORVASC) 10 MG tablet Take 10 mg by mouth daily.     Marland Kitchen. atenolol (TENORMIN) 100 MG tablet Take 100 mg by mouth daily.     . clopidogrel (PLAVIX) 75 MG tablet Take 75 mg by mouth daily.     Marland Kitchen. donepezil (ARICEPT) 5 MG tablet Take 5 mg by mouth at bedtime.     . furosemide (LASIX) 20 MG tablet Take 1 tablet (20 mg  total) by mouth daily. 90 tablet 3  . lisinopril (PRINIVIL,ZESTRIL) 40 MG tablet Take 40 mg by mouth daily.     . potassium chloride (K-DUR) 10 MEQ tablet Take 1 tablet (10 mEq total) by mouth daily. 90 tablet 3  . simvastatin (ZOCOR) 20 MG tablet Take 20 mg by mouth daily.         Review of Systems negative except from HPI and PMH  Physical Exam BP (!) 152/64   Pulse (!) 59   Temp 97.2 F (36.2 C) (Oral)   Resp 18   Ht 5\' 2"  (1.575 m)   Wt 104 lb (47.2 kg)   SpO2 95%   BMI 19.02 kg/m  Well developed and well nourished in no acute distress HENT normal E scleral and icterus clear Neck Supple JVP flat; carotids brisk and full Clear to ausculation Collateral veins R chest Regular rate and rhythm, no murmurs gallops or rub Soft with active bowel sounds No clubbing cyanosis  Edema Alert and oriented, grossly normal motor and sensory function Skin Warm and Dry    Assessment and  Plan  Complete heart block  Pacemaker  St Jude   Hypertension   Dementia  CAD-prior PCI    We have  reviewed the benefits and risks of generator replacement.  These include but are not limited to lead fracture and infection.  The patient understands, agrees and is willing to proceed.

## 2016-05-11 NOTE — H&P (Signed)
Cardiology Office Note Date: 05/11/16 Patient ID:  Kathy Solomon, DOB [birth year], MRN [id number] PCP:  Devra Dopp, MD         Cardiologist:  Dr. Elease Hashimoto Electrophysiologist: Dr. Graciela Husbands   Reason for visit: PPM generator change  History of Present Illness: Kathy Solomon is a 83 y.o. female with history of CAD, CHB w/PPM, her PPM was noted to have reached ERI and planned for generator change today with Dr. Graciela Husbands.   She comes today feeling well, denies any kind of CP, palpitations or SOB, no dizziness, near syncope or syncope.  She remains active caring for her home, does errands with her daughter.  She denies any recent illness/fever, no N/V/D.  Mentions she feels like since her battery got low she has less energy.   Device information SJM single lead (VDD lead) chamber PPM, original implant 2000, gen change  (done in [place]) PACER DEPENDENT      Past Medical History:  Diagnosis Date  . Complete heart block (HCC)   . Heart murmur   . Ischemic heart disease   . Left carotid bruit   . Pacemaker - St Judes    VDD Lead          Past Surgical History:  Procedure Laterality Date  . ABDOMINAL HYSTERECTOMY    . EP IMPLANTABLE DEVICE    . MOLE REMOVAL            Current Outpatient Prescriptions  Medication Sig Dispense Refill  . amLODipine (NORVASC) 10 MG tablet Take 10 mg by mouth daily.     Marland Kitchen aspirin 81 MG tablet Take 81 mg by mouth daily.    Marland Kitchen atenolol (TENORMIN) 100 MG tablet Take 50 mg by mouth 2 (two) times daily. Take 1/2 tablet to = 50 mg bid    . clopidogrel (PLAVIX) 75 MG tablet Take 75 mg by mouth daily.     Marland Kitchen donepezil (ARICEPT) 5 MG tablet Take 5 mg by mouth at bedtime.     . furosemide (LASIX) 20 MG tablet Take 1 tablet (20 mg total) by mouth daily. 90 tablet 3  . lisinopril (PRINIVIL,ZESTRIL) 40 MG tablet Take 40 mg by mouth daily.     . potassium chloride (K-DUR) 10 MEQ tablet Take 1 tablet (10 mEq total) by mouth  daily. 90 tablet 3  . simvastatin (ZOCOR) 20 MG tablet Take 20 mg by mouth daily.      No current facility-administered medications for this visit.     Allergies:   Clonidine derivatives   Social History:  The patient  reports that she quit smoking about 22 years ago. She has never used smokeless tobacco. She reports that she does not drink alcohol or use drugs.   Family History:  The patient's family history includes Hypertension in her father and mother.  ROS:  Please see the history of present illness.  All other systems are reviewed and otherwise negative.   PHYSICAL EXAM:  VS:  BP (!) 152/64   Pulse (!) 59   Temp 97.2 F (36.2 C) (Oral)   Resp 18   Ht 5\' 2"  (1.575 m)   Wt 104 lb (47.2 kg)   SpO2 95%   BMI 19.02 kg/m  Well thin, though well nourished, well developed, in no acute distress  HEENT: normocephalic, atraumatic  Neck: no JVD, carotid bruits or masses Cardiac:  IRRR; 1/6 SM, no rubs, or gallops Lungs:  CTA b/l, no wheezing, rhonchi or rales  Abd: soft, nontender MS: no  deformity or atrophy Ext:  trace edema b/l LE Skin: warm and dry, no rash Neuro:  No gross deficits appreciated Psych: euthymic mood, full affect  PPM site is stable, no tethering or discomfort (right side)   EKG:  Done 03/28/16 shows SR, PAC's V paced, baseline artifact, appears some are V paced without P wave PPM interrogation 03/28/16 : ERI reached 03/13/16, programmed VDD 60bpm, she is pacer dependent, lead measurements are stable  01/15/16: TTE Study Conclusions - Left ventricle: The cavity size was normal. Systolic function was normal. The estimated ejection fraction was in the range of 60% to 65%. Wall motion was normal; there were no regional wall motion abnormalities. - Aortic valve: Trileaflet; mildly thickened, mildly calcified leaflets. - Mitral valve: Moderately to severely calcified annulus. There was moderate regurgitation. - Tricuspid valve: There was  mild regurgitation. - Pulmonary arteries: Systolic pressure was mildly increased. PA peak pressure: 41 mm Hg (S).  Recent Labs: 05/05/16 labs reviewed 01/14/2016: B Natriuretic Peptide 580.1 01/15/2016: Magnesium 1.7; TSH 3.126 01/19/2016: Hemoglobin 13.0; Platelets 271 03/14/2016: BUN 13; Creatinine, Ser 0.85; Potassium 3.7; Sodium 140  No results found for requested labs within last 8760 hours.   Estimated Creatinine Clearance: 35.4 mL/min (by C-G formula based on SCr of 0.85 mg/dL).      Wt Readings from Last 3 Encounters:  03/28/16 104 lb (47.2 kg)  03/14/16 105 lb (47.6 kg)  02/03/16 100 lb 11.2 oz (45.7 kg)     Other studies reviewed: Additional studies/records reviewed today include: summarized above  ASSESSMENT AND PLAN:  1. CHB w/PPM     ERI reached     risks/benefits of generator change procedure re-discussed, she would like to proceed.  Rhythm sounds irregular today, will get an EKG, though note she has PAC's by her previous EKGs      2. CAD     no CP     C/w Dr. Elease HashimotoNahser    Signed, Sherrilee Gillesenee Ursy, PA-C 05/11/2016

## 2016-05-11 NOTE — Discharge Instructions (Signed)

## 2016-05-12 ENCOUNTER — Encounter (HOSPITAL_COMMUNITY): Payer: Self-pay | Admitting: Internal Medicine

## 2016-05-16 ENCOUNTER — Other Ambulatory Visit: Payer: Self-pay | Admitting: Adult Health

## 2016-05-18 ENCOUNTER — Ambulatory Visit (INDEPENDENT_AMBULATORY_CARE_PROVIDER_SITE_OTHER): Payer: Medicare Other | Admitting: *Deleted

## 2016-05-18 DIAGNOSIS — I442 Atrioventricular block, complete: Secondary | ICD-10-CM | POA: Diagnosis not present

## 2016-05-18 NOTE — Progress Notes (Signed)
Wound check appointment. Dermabond removed, top layer of epidermis noted to have removed at bilateral edges of incision site. Dry Gauze and paper tape applied to protect area from pts bra strap. Wound without redness or edema. Incision edges approximated. Normal healing stages of bruising present. Normal device function. Threshold, sensing, and impedances consistent with implant measurements. Device programmed at chronic outputs. Histogram distribution appropriate for patient and level of activity. No mode switches or high ventricular rates noted. Patient educated about wound care. ROV 08/09/2016 w/ SK.

## 2016-06-13 ENCOUNTER — Encounter: Payer: Self-pay | Admitting: Cardiovascular Disease

## 2016-06-13 ENCOUNTER — Ambulatory Visit (INDEPENDENT_AMBULATORY_CARE_PROVIDER_SITE_OTHER): Payer: Medicare Other | Admitting: Cardiovascular Disease

## 2016-06-13 VITALS — BP 110/60 | HR 78 | Ht 62.0 in | Wt 107.1 lb

## 2016-06-13 DIAGNOSIS — I2510 Atherosclerotic heart disease of native coronary artery without angina pectoris: Secondary | ICD-10-CM | POA: Diagnosis not present

## 2016-06-13 DIAGNOSIS — I10 Essential (primary) hypertension: Secondary | ICD-10-CM

## 2016-06-13 DIAGNOSIS — E782 Mixed hyperlipidemia: Secondary | ICD-10-CM | POA: Diagnosis not present

## 2016-06-13 DIAGNOSIS — I5032 Chronic diastolic (congestive) heart failure: Secondary | ICD-10-CM | POA: Diagnosis not present

## 2016-06-13 NOTE — Progress Notes (Signed)
Cardiology Office Note   Date:  06/13/2016   ID:  Kathy Solomon, DOB 24-Apr-1927, MRN [id number]  PCP:  Devra Dopp, MD  Cardiologist: Cassell Clement MD -->   Nahser   Chief Complaint  Patient presents with  . Follow-up    chronic diastolic CHF      History of Present Illness: Kathy Solomon is a 83 y.o. female who presents for scheduled 6 month follow-up visit  This pleasant 83 year old woman was initially seen at the request of [facility] to establish cardiology care here in the [place] area. The patient moved to [place] several years ago from [place]. She initially received her cardiology care at [facility] but because of the distance involved wishes to transfer her to [place]. [place] The patient has a history of ischemic heart disease. She had a myocardial infarction in 1994 treated with PCI in the [facility]. She has subsequently been on aspirin and Plavix. She has a past history of sick sinus syndrome and has a St. Jude dual-chamber pacemaker implanted in [place] on 03/17/1998. She has had her battery changed once since then.Her pacemaker is followed by Dr. Graciela Husbands. She has a past history of a known systolic murmur secondary to mild aortic valve disease. She also has a past history of a known left carotid bruit previous carotid duplex in 2012 showing no significant stenosis. She has not been having any TIA symptoms. The patient has not been experiencing any recent cardiac symptoms. She denies any chest pain or shortness of breath. She walks for 30 minutes daily with a neighbor friend in the neighborhood or exercise. She has occasional ankle edema. She has not been having a dizziness or syncope. She has not been having any racing of her heart or tachycardia. She has had no TIA symptoms. Her appetite is good and her weight has been  stable. She quit drinking alcohol 25 years ago. She quit smoking many years ago when she had her heart attack. Social history reveals that she moved to [place] to be near her daughter.Her daughter drives her to her appointments. Her husband died of leukemia about 4 years ago. Since last visit she had carotid Dopplers done on 11/12/14 which showed less than 39% stenosis bilaterally.  A repeat study in 2 years was recommended.  She has not been experiencing any TIA symptoms..  Jan. 29, 2018:  Kathy Solomon is seen for the first time today. She is a transfer from Dr. Patty Sermons.   Seen with daughter , [name]  In November, she was admitted with increased dyspnea.   Was thought to to be CHF .  Was put on Lasix Having trouble getting someone to prescribe the K-dur   June 13, 2016: Kathy Solomon is seen today for a follow up visit - seen with daughter , [name] .  She had a pacer generator change last month.  Staying active .  Has chronic diastolic CHF  - very stable    Past Medical History:  Diagnosis Date  . Complete heart block (HCC)   . Heart murmur   . Ischemic heart disease   . Left carotid bruit   . Pacemaker - St Judes    VDD Lead     Past Surgical History:  Procedure Laterality Date  . ABDOMINAL HYSTERECTOMY    . EP IMPLANTABLE DEVICE    . MOLE REMOVAL    . PPM GENERATOR CHANGEOUT  N/A 05/11/2016   Procedure: PPM Generator Changeout;  Surgeon: Duke Salvia, MD;  Location: [facility] INVASIVE CV LAB;  Service: Cardiovascular;  Laterality: N/A;     Current Outpatient Prescriptions  Medication Sig Dispense Refill  . amLODipine (NORVASC) 10 MG tablet Take 10 mg by mouth daily.     Marland Kitchen atenolol (TENORMIN) 100 MG tablet Take 100 mg by mouth daily.     . clopidogrel (PLAVIX) 75 MG tablet Take 75 mg by mouth daily.     Marland Kitchen donepezil (ARICEPT) 5 MG tablet Take 5 mg by mouth at bedtime.     . furosemide (LASIX) 20 MG tablet Take 1 tablet (20 mg total) by mouth daily. 90 tablet 3  . lisinopril  (PRINIVIL,ZESTRIL) 40 MG tablet Take 40 mg by mouth daily.     . potassium chloride (K-DUR) 10 MEQ tablet Take 1 tablet (10 mEq total) by mouth daily. 90 tablet 3  . simvastatin (ZOCOR) 20 MG tablet Take 20 mg by mouth daily.      No current facility-administered medications for this visit.     Allergies:   Clonidine derivatives    Social History:  The patient  reports that she quit smoking about 23 years ago. She has never used smokeless tobacco. She reports that she does not drink alcohol or use drugs.   Family History:  The patient's family history includes Hypertension in her father and mother.    ROS:  Please see the history of present illness.   Otherwise, review of systems are positive for none.   All other systems are reviewed and negative.    PHYSICAL EXAM: VS:  BP 110/60 (BP Location: Left Arm, Patient Position: Sitting, Cuff Size: Normal)   Pulse 78   Ht  (1.575 m)   Wt 107 lb 1.9 oz (48.6 kg)   SpO2 97%   BMI 19.59 kg/m  , BMI Body mass index is 19.59 kg/m. GEN: Well nourished, well developed, in no acute distress  HEENT: normal  Neck: no JVD, there is a soft carotid bruit on the right and a moderate carotid bruit on the left., or masses Cardiac: RRR; there is a grade 2/6 systolic ejection murmur at the aortic area.  No diastolic murmur.  No, rubs, or gallops,no edema  Respiratory:  clear to auscultation bilaterally, normal work of breathing GI: soft, nontender, nondistended, + BS MS: no deformity or atrophy  Skin: warm and dry, no rash Neuro:  Strength and sensation are intact Psych: euthymic mood, full affect  EKG:  EKG is not ordered today.    Recent Labs: 01/14/2016: B Natriuretic Peptide 580.1 01/15/2016: Magnesium 1.7; TSH 3.126 01/19/2016: Hemoglobin 13.0 05/05/2016: BUN 19; Creatinine, Ser 1.09; Platelets 204; Potassium 4.1; Sodium 141    Lipid Panel No results found for: CHOL, TRIG, HDL, CHOLHDL, VLDL, LDLCALC, LDLDIRECT    Wt Readings from  Last 3 Encounters:  06/13/16 107 lb 1.9 oz (48.6 kg)  05/11/16 104 lb (47.2 kg)  03/28/16 104 lb (47.2 kg)        ASSESSMENT AND PLAN:  1.   Acute on Chronic diastolic CHF:  She seems to be doing very well.  Continue current meds.    2.    ischemic heart disease status post acute myocardial infarction with PCI in 1994. No subsequent angina.  3. asymptomatic bilateral carotid bruits  4. history of sick sinus syndrome with functioning dual-chamber pacemaker (St. Jude) implanted in 2000, s/p recent gen change in March, 2018  5.  murmur of aortic stenosis, asymptomatic  Current medicines are reviewed at length with the patient today.  The patient does not have concerns regarding medicines.  The following changes have been made:  no change  Labs/ tests ordered today include:   No orders of the defined types were placed in this encounter.    Kristeen Miss, MD  06/13/2016 11:22 AM    [facility] [address] [place],  [address] [place], [place]  [address] Pager [phone] Phone: [phone]; Fax: [phone]

## 2016-06-13 NOTE — Patient Instructions (Signed)

## 2016-08-09 ENCOUNTER — Ambulatory Visit (INDEPENDENT_AMBULATORY_CARE_PROVIDER_SITE_OTHER): Payer: Medicare Other | Admitting: Internal Medicine

## 2016-08-09 ENCOUNTER — Encounter: Payer: Self-pay | Admitting: Internal Medicine

## 2016-08-09 VITALS — BP 138/68 | HR 66 | Ht 63.0 in | Wt 102.0 lb

## 2016-08-09 DIAGNOSIS — Z950 Presence of cardiac pacemaker: Secondary | ICD-10-CM

## 2016-08-09 DIAGNOSIS — I442 Atrioventricular block, complete: Secondary | ICD-10-CM

## 2016-08-09 DIAGNOSIS — I2510 Atherosclerotic heart disease of native coronary artery without angina pectoris: Secondary | ICD-10-CM

## 2016-08-09 LAB — CUP PACEART INCLINIC DEVICE CHECK
Battery Remaining Longevity: 94 mo
Battery Voltage: 3.01 V
Brady Statistic RA Percent Paced: 0 %
Date Time Interrogation Session: 20180626161829
Implantable Lead Model: 1368
Implantable Pulse Generator Implant Date: 20180328
Lead Channel Impedance Value: 262.5 Ohm
Lead Channel Impedance Value: 475 Ohm
Lead Channel Setting Pacing Amplitude: 2 V
Lead Channel Setting Pacing Pulse Width: 1 ms
MDC IDC LEAD IMPLANT DT: 20000201
MDC IDC LEAD LOCATION: 753860
MDC IDC MSMT LEADCHNL RA SENSING INTR AMPL: 0.5 mV
MDC IDC MSMT LEADCHNL RV PACING THRESHOLD AMPLITUDE: 1 V
MDC IDC MSMT LEADCHNL RV PACING THRESHOLD AMPLITUDE: 1 V
MDC IDC MSMT LEADCHNL RV PACING THRESHOLD PULSEWIDTH: 1 ms
MDC IDC MSMT LEADCHNL RV PACING THRESHOLD PULSEWIDTH: 1 ms
MDC IDC PG SERIAL: 7999856
MDC IDC SET LEADCHNL RV SENSING SENSITIVITY: 4 mV
MDC IDC STAT BRADY RV PERCENT PACED: 99.97 %
Pulse Gen Model: 2272

## 2016-08-09 NOTE — Progress Notes (Signed)
Patient Care Team: Devra Dopp, MD as PCP - General (Family Medicine)   HPI  Kathy Solomon is a 83 y.o. female Seen to establish pacemaker follow-up for device implanted in [place] for complete hart block originally in 2000 with one interval generator replacement. She has a history of coronary artery disease with prior MI treated with PCI [facility] in [place]. She has known mild aortic valve disease.  The patient denies chest pain, shortness of breath, nocturnal dyspnea, orthopnea or peripheral edema.  There have been no palpitations, lightheadedness or syncope.   She is having hallucinations that are quite frightening. She is being referred to neurology.     She has no bleeding issues    Past Medical History:  Diagnosis Date  . Complete heart block (HCC)   . Heart murmur   . Ischemic heart disease   . Left carotid bruit   . Pacemaker - St Judes    VDD Lead     Past Surgical History:  Procedure Laterality Date  . ABDOMINAL HYSTERECTOMY    . EP IMPLANTABLE DEVICE    . MOLE REMOVAL    . PPM GENERATOR CHANGEOUT N/A 05/11/2016   Procedure: PPM Generator Changeout;  Surgeon: Duke Salvia, MD;  Location: [facility] INVASIVE CV LAB;  Service: Cardiovascular;  Laterality: N/A;    Current Outpatient Prescriptions  Medication Sig Dispense Refill  . amLODipine (NORVASC) 10 MG tablet Take 10 mg by mouth daily.     Marland Kitchen atenolol (TENORMIN) 100 MG tablet Take 100 mg by mouth daily.     . clopidogrel (PLAVIX) 75 MG tablet Take 75 mg by mouth daily.     Marland Kitchen donepezil (ARICEPT) 5 MG tablet Take 5 mg by mouth at bedtime.     . furosemide (LASIX) 20 MG tablet Take 20 mg by mouth daily.    Marland Kitchen lisinopril (PRINIVIL,ZESTRIL) 40 MG tablet Take 40 mg by mouth daily.     . potassium chloride (K-DUR) 10 MEQ tablet Take 10 mEq by mouth daily.    . simvastatin (ZOCOR) 20 MG tablet Take 20 mg by mouth daily.      No current facility-administered medications for this visit.      Allergies  Allergen Reactions  . Clonidine Derivatives Nausea Only, Anxiety and Other (See Comments)     "feel like a zombie"   Soc hx  Moved to [place] following death of husband to be near her daughter and one grandson  No smoking  Fhx neg for compelte heart block Review of Systems negative except from HPI and PMH  Physical Exam BP 138/68   Pulse 66   Ht 5\' 3"  (1.6 m)   Wt 102 lb (46.3 kg)   LMP  (LMP Unknown)   BMI 18.07 kg/m  Well developed and well nourished in no acute distress HENT normal E scleral and icterus clear Neck Supple JVP flat; carotids brisk and full Clear to ausculation Device pocket well healed; without hematoma or erythema.   There is no tethering  Regular rate and rhythm, no murmurs gallops or rub Soft with active bowel sounds No clubbing cyanosis  Edema Alert and oriented, grossly normal motor and sensory function Skin Warm and Dry  ECG demonstrates P-synchronous/ AV  pacing  Assessment and  Plan  Complete heart block  Pacemaker-St. Jude-VDD lead   Hypertension  Pacemaker mediated tachycardia  Atrial lead noise with inappropriate mode switch  Ventricular oversensing  There has been no further ventricular pacing inhibition  that we know of. There has been atrial mode switching as noted above. This has not impacted ventricular pacing.  BP well controlled  Without symptoms of ischemia  On plavix;  No bleeding issues

## 2016-08-09 NOTE — Patient Instructions (Signed)

## 2016-08-22 ENCOUNTER — Other Ambulatory Visit: Payer: Self-pay | Admitting: Adult Health

## 2016-11-08 ENCOUNTER — Ambulatory Visit (INDEPENDENT_AMBULATORY_CARE_PROVIDER_SITE_OTHER): Payer: Medicare Other | Admitting: *Deleted

## 2016-11-08 DIAGNOSIS — I442 Atrioventricular block, complete: Secondary | ICD-10-CM | POA: Diagnosis not present

## 2016-11-08 NOTE — Progress Notes (Signed)
Remote pacemaker transmission.   

## 2016-11-10 ENCOUNTER — Encounter: Payer: Self-pay | Admitting: Cardiology

## 2016-11-11 LAB — CUP PACEART REMOTE DEVICE CHECK
Battery Remaining Percentage: 95.5 %
Battery Voltage: 3.01 V
Brady Statistic AS VP Percent: 87 %
Brady Statistic RV Percent Paced: 99 %
Date Time Interrogation Session: 20180925060028
Lead Channel Impedance Value: 260 Ohm
Lead Channel Sensing Intrinsic Amplitude: 1 mV
Lead Channel Setting Pacing Amplitude: 2 V
MDC IDC LEAD IMPLANT DT: 20000201
MDC IDC LEAD LOCATION: 753860
MDC IDC MSMT BATTERY REMAINING LONGEVITY: 101 mo
MDC IDC MSMT LEADCHNL RV IMPEDANCE VALUE: 480 Ohm
MDC IDC MSMT LEADCHNL RV PACING THRESHOLD AMPLITUDE: 1 V
MDC IDC MSMT LEADCHNL RV PACING THRESHOLD PULSEWIDTH: 1 ms
MDC IDC PG IMPLANT DT: 20180328
MDC IDC PG SERIAL: 7999856
MDC IDC SET LEADCHNL RV PACING PULSEWIDTH: 1 ms
MDC IDC SET LEADCHNL RV SENSING SENSITIVITY: 4 mV
MDC IDC STAT BRADY AS VS PERCENT: 1 %
Pulse Gen Model: 2272

## 2017-02-09 ENCOUNTER — Ambulatory Visit (INDEPENDENT_AMBULATORY_CARE_PROVIDER_SITE_OTHER): Payer: Medicare Other | Admitting: *Deleted

## 2017-02-09 DIAGNOSIS — I442 Atrioventricular block, complete: Secondary | ICD-10-CM

## 2017-02-09 NOTE — Progress Notes (Signed)
Remote pacemaker transmission.   

## 2017-02-10 ENCOUNTER — Encounter: Payer: Self-pay | Admitting: Cardiology

## 2017-02-26 ENCOUNTER — Other Ambulatory Visit: Payer: Self-pay | Admitting: Cardiovascular Disease

## 2017-02-27 LAB — CUP PACEART REMOTE DEVICE CHECK
Battery Remaining Longevity: 100 mo
Brady Statistic AS VS Percent: 1 %
Brady Statistic RV Percent Paced: 99 %
Implantable Lead Implant Date: 20000201
Implantable Lead Location: 753860
Implantable Pulse Generator Implant Date: 20180328
Lead Channel Impedance Value: 250 Ohm
Lead Channel Impedance Value: 460 Ohm
Lead Channel Pacing Threshold Amplitude: 1 V
Lead Channel Setting Pacing Pulse Width: 1 ms
MDC IDC MSMT BATTERY REMAINING PERCENTAGE: 95.5 %
MDC IDC MSMT BATTERY VOLTAGE: 3.01 V
MDC IDC MSMT LEADCHNL RA SENSING INTR AMPL: 0.8 mV
MDC IDC MSMT LEADCHNL RV PACING THRESHOLD PULSEWIDTH: 1 ms
MDC IDC SESS DTM: 20181227070014
MDC IDC SET LEADCHNL RV PACING AMPLITUDE: 2 V
MDC IDC SET LEADCHNL RV SENSING SENSITIVITY: 4 mV
MDC IDC STAT BRADY AS VP PERCENT: 90 %
Pulse Gen Model: 2272
Pulse Gen Serial Number: 7999856

## 2017-05-11 ENCOUNTER — Ambulatory Visit (INDEPENDENT_AMBULATORY_CARE_PROVIDER_SITE_OTHER): Payer: Medicare Other | Admitting: *Deleted

## 2017-05-11 DIAGNOSIS — I442 Atrioventricular block, complete: Secondary | ICD-10-CM | POA: Diagnosis not present

## 2017-05-12 NOTE — Progress Notes (Signed)
Remote pacemaker transmission.   

## 2017-05-15 ENCOUNTER — Encounter: Payer: Self-pay | Admitting: Cardiology

## 2017-05-27 ENCOUNTER — Other Ambulatory Visit: Payer: Self-pay | Admitting: Cardiovascular Disease

## 2017-05-29 ENCOUNTER — Encounter: Payer: Medicare Other | Admitting: Internal Medicine

## 2017-05-30 LAB — CUP PACEART REMOTE DEVICE CHECK
Battery Remaining Longevity: 107 mo
Brady Statistic AS VP Percent: 89 %
Brady Statistic AS VS Percent: 1 %
Brady Statistic RV Percent Paced: 99 %
Date Time Interrogation Session: 20190328060015
Implantable Lead Implant Date: 20000201
Implantable Lead Location: 753860
Implantable Pulse Generator Implant Date: 20180328
Lead Channel Impedance Value: 260 Ohm
Lead Channel Impedance Value: 460 Ohm
Lead Channel Pacing Threshold Amplitude: 1 V
Lead Channel Setting Pacing Pulse Width: 1 ms
MDC IDC MSMT BATTERY REMAINING PERCENTAGE: 95.5 %
MDC IDC MSMT BATTERY VOLTAGE: 3.01 V
MDC IDC MSMT LEADCHNL RA SENSING INTR AMPL: 0.7 mV
MDC IDC MSMT LEADCHNL RV PACING THRESHOLD PULSEWIDTH: 1 ms
MDC IDC SET LEADCHNL RV PACING AMPLITUDE: 2 V
MDC IDC SET LEADCHNL RV SENSING SENSITIVITY: 4 mV
Pulse Gen Model: 2272
Pulse Gen Serial Number: 7999856

## 2017-06-06 ENCOUNTER — Encounter: Payer: Medicare Other | Admitting: Internal Medicine

## 2017-06-12 ENCOUNTER — Encounter: Payer: Self-pay | Admitting: Internal Medicine

## 2017-06-12 ENCOUNTER — Other Ambulatory Visit: Payer: Self-pay | Admitting: Cardiovascular Disease

## 2017-06-12 ENCOUNTER — Ambulatory Visit (INDEPENDENT_AMBULATORY_CARE_PROVIDER_SITE_OTHER): Payer: Medicare Other | Admitting: Internal Medicine

## 2017-06-12 VITALS — BP 160/60 | HR 60 | Ht 63.0 in | Wt 109.0 lb

## 2017-06-12 DIAGNOSIS — I442 Atrioventricular block, complete: Secondary | ICD-10-CM | POA: Diagnosis not present

## 2017-06-12 DIAGNOSIS — Z950 Presence of cardiac pacemaker: Secondary | ICD-10-CM | POA: Diagnosis not present

## 2017-06-12 DIAGNOSIS — I5032 Chronic diastolic (congestive) heart failure: Secondary | ICD-10-CM | POA: Diagnosis not present

## 2017-06-12 DIAGNOSIS — E782 Mixed hyperlipidemia: Secondary | ICD-10-CM | POA: Diagnosis not present

## 2017-06-12 NOTE — Progress Notes (Signed)
Patient Care Team: Devra Dopp, MD as PCP - General (Family Medicine)   HPI  Kathy Solomon is a 84 y.o. female Seen to establish pacemaker follow-up for device implanted in [place] for complete hart block originally in 2000 with one interval generator replacement. She has a history of coronary artery disease with prior MI treated with PCI [facility] in [place]. She has known mild aortic valve disease.  She continues to have hallucinations.  She has moved to assisted living at [facility].  She is enjoying that.  No chest pain shortness of breath or edema no bleeding.     Past Medical History:  Diagnosis Date  . Complete heart block (HCC)   . Heart murmur   . Ischemic heart disease   . Left carotid bruit   . Pacemaker - St Judes    VDD Lead     Past Surgical History:  Procedure Laterality Date  . ABDOMINAL HYSTERECTOMY    . EP IMPLANTABLE DEVICE    . MOLE REMOVAL    . PPM GENERATOR CHANGEOUT N/A 05/11/2016   Procedure: PPM Generator Changeout;  Surgeon: Duke Salvia, MD;  Location: [facility] INVASIVE CV LAB;  Service: Cardiovascular;  Laterality: N/A;    Current Outpatient Medications  Medication Sig Dispense Refill  . amLODipine (NORVASC) 10 MG tablet Take 10 mg by mouth daily.     Marland Kitchen atenolol (TENORMIN) 100 MG tablet Take 100 mg by mouth daily.     . clopidogrel (PLAVIX) 75 MG tablet Take 75 mg by mouth daily.     Marland Kitchen donepezil (ARICEPT) 5 MG tablet Take 5 mg by mouth at bedtime.     . furosemide (LASIX) 20 MG tablet Take 1 tablet (20 mg total) by mouth daily. Please make overdue yearly appt with Dr. Elease Hashimoto for April before anymore refills. 2nd attempt 15 tablet 0  . lisinopril (PRINIVIL,ZESTRIL) 40 MG tablet Take 40 mg by mouth daily.     . potassium chloride (KLOR-CON 10) 10 MEQ tablet Take 1 tablet (10 mEq total) by mouth daily. Please make overdue yearly appt with Dr. Elease Hashimoto for April before anymore refills. 2nd attempt 15 tablet 0  .  simvastatin (ZOCOR) 20 MG tablet Take 20 mg by mouth daily.      No current facility-administered medications for this visit.     Allergies  Allergen Reactions  . Clonidine Derivatives Nausea Only, Anxiety and Other (See Comments)     "feel like a zombie"   Soc hx  Moved to [place] following death of husband to be near her daughter and one grandson  No smoking  Fhx neg for compelte heart block Review of Systems negative except from HPI and PMH  Physical Exam BP (!) 160/60   Pulse 60   Ht  (1.6 m)   Wt 109 lb (49.4 kg)   LMP  (LMP Unknown)   SpO2 94%   BMI 19.31 kg/m  Well developed and nourished in no acute distress HENT normal Neck supple with JVP-flat Clear Regular rate and rhythm, no murmurs or gallops Abd-soft with active BS No Clubbing cyanosis edema Skin-warm and dry A & Oriented  Grossly normal sensory and motor function   ECG demonstrates P-synchronous/ AV  pacing   Assessment and  Plan  Complete heart block  Pacemaker-St. Jude-VDD lead   Hypertension  Sinus node dysfunction  Pacemaker mediated tachycardia  Atrial lead noise with inappropriate mode switch  Ventricular oversensing   There is some  sinus slowing resulting in ventricular pacing; there are no significant symptoms.  She is on high-dose atenolol for her blood pressure which may be aggravating his tendency.  However, in the absence of symptoms we will continue to follow.  Blood pressure is being managed by primary care  Discussed with her and her daughter

## 2017-06-12 NOTE — Patient Instructions (Addendum)
Medication Instructions:  Your physician recommends that you continue on your current medications as directed. Please refer to the Current Medication list given to you today.  Labwork: None ordered.  Testing/Procedures: None ordered.  Follow-Up: Your physician wants you to follow-up in: One Year with Dr Graciela Husbands. You will receive a reminder letter in the mail two months in advance. If you don't receive a letter, please call our office to schedule the follow-up appointment.  Remote monitoring is used to monitor your Pacemaker of ICD from home. This monitoring reduces the number of office visits required to check your device to one time per year. It allows [place] to keep an eye on the functioning of your device to ensure it is working properly. You are scheduled for a device check from home on 08/10/2017. You may send your transmission at any time that day. If you have a wireless device, the transmission will be sent automatically. After your physician reviews your transmission, you will receive a postcard with your next transmission date.     Any Other Special Instructions Will Be Listed Below (If Applicable).     If you need a refill on your cardiac medications before your next appointment, please call your pharmacy.

## 2017-06-20 LAB — CUP PACEART INCLINIC DEVICE CHECK
Battery Voltage: 2.99 V
Brady Statistic RA Percent Paced: 0 %
Brady Statistic RV Percent Paced: 99.97 %
Implantable Lead Implant Date: 20000201
Implantable Lead Location: 753860
Implantable Pulse Generator Implant Date: 20180328
Lead Channel Impedance Value: 262.5 Ohm
Lead Channel Impedance Value: 462.5 Ohm
Lead Channel Pacing Threshold Pulse Width: 1 ms
Lead Channel Sensing Intrinsic Amplitude: 1.5 mV
Lead Channel Setting Pacing Amplitude: 2 V
Lead Channel Setting Pacing Pulse Width: 1 ms
Lead Channel Setting Sensing Sensitivity: 4 mV
MDC IDC MSMT BATTERY REMAINING LONGEVITY: 100 mo
MDC IDC MSMT LEADCHNL RV PACING THRESHOLD AMPLITUDE: 0.75 V
MDC IDC SESS DTM: 20190429201227
Pulse Gen Model: 2272
Pulse Gen Serial Number: 7999856

## 2017-08-10 ENCOUNTER — Ambulatory Visit (INDEPENDENT_AMBULATORY_CARE_PROVIDER_SITE_OTHER): Payer: Medicare Other | Admitting: *Deleted

## 2017-08-10 ENCOUNTER — Encounter: Payer: Self-pay | Admitting: Cardiology

## 2017-08-10 DIAGNOSIS — I442 Atrioventricular block, complete: Secondary | ICD-10-CM | POA: Diagnosis not present

## 2017-08-10 NOTE — Progress Notes (Signed)
Remote pacemaker transmission.   

## 2017-08-22 LAB — CUP PACEART REMOTE DEVICE CHECK
Battery Remaining Longevity: 108 mo
Battery Remaining Percentage: 95.5 %
Battery Voltage: 3.01 V
Brady Statistic AS VP Percent: 79 %
Brady Statistic RV Percent Paced: 99 %
Date Time Interrogation Session: 20190627065008
Implantable Lead Model: 1368
Implantable Pulse Generator Implant Date: 20180328
Lead Channel Impedance Value: 280 Ohm
Lead Channel Sensing Intrinsic Amplitude: 0.2 mV
Lead Channel Setting Pacing Amplitude: 2 V
Lead Channel Setting Pacing Pulse Width: 1 ms
MDC IDC LEAD IMPLANT DT: 20000201
MDC IDC LEAD LOCATION: 753860
MDC IDC MSMT LEADCHNL RV IMPEDANCE VALUE: 460 Ohm
MDC IDC MSMT LEADCHNL RV PACING THRESHOLD AMPLITUDE: 0.75 V
MDC IDC MSMT LEADCHNL RV PACING THRESHOLD PULSEWIDTH: 1 ms
MDC IDC PG SERIAL: 7999856
MDC IDC SET LEADCHNL RV SENSING SENSITIVITY: 4 mV
MDC IDC STAT BRADY AS VS PERCENT: 1 %
Pulse Gen Model: 2272

## 2017-11-09 ENCOUNTER — Ambulatory Visit (INDEPENDENT_AMBULATORY_CARE_PROVIDER_SITE_OTHER): Payer: Medicare Other | Admitting: *Deleted

## 2017-11-09 DIAGNOSIS — I442 Atrioventricular block, complete: Secondary | ICD-10-CM | POA: Diagnosis not present

## 2017-11-09 NOTE — Progress Notes (Signed)
Remote pacemaker transmission.   

## 2017-11-10 ENCOUNTER — Encounter: Payer: Self-pay | Admitting: Cardiology

## 2017-12-20 LAB — CUP PACEART REMOTE DEVICE CHECK
Battery Remaining Longevity: 107 mo
Battery Remaining Percentage: 95.5 %
Brady Statistic AS VP Percent: 67 %
Brady Statistic RV Percent Paced: 99 %
Date Time Interrogation Session: 20190926060014
Implantable Lead Implant Date: 20000201
Implantable Pulse Generator Implant Date: 20180328
Lead Channel Pacing Threshold Amplitude: 0.75 V
Lead Channel Sensing Intrinsic Amplitude: 0.7 mV
Lead Channel Sensing Intrinsic Amplitude: 12 mV
Lead Channel Setting Pacing Amplitude: 2 V
Lead Channel Setting Sensing Sensitivity: 4 mV
MDC IDC LEAD LOCATION: 753860
MDC IDC MSMT BATTERY VOLTAGE: 3.01 V
MDC IDC MSMT LEADCHNL RA IMPEDANCE VALUE: 260 Ohm
MDC IDC MSMT LEADCHNL RV IMPEDANCE VALUE: 460 Ohm
MDC IDC MSMT LEADCHNL RV PACING THRESHOLD PULSEWIDTH: 1 ms
MDC IDC SET LEADCHNL RV PACING PULSEWIDTH: 1 ms
MDC IDC STAT BRADY AS VS PERCENT: 1 %
Pulse Gen Serial Number: 7999856

## 2018-02-08 ENCOUNTER — Ambulatory Visit (INDEPENDENT_AMBULATORY_CARE_PROVIDER_SITE_OTHER): Payer: Medicare Other

## 2018-02-08 DIAGNOSIS — I442 Atrioventricular block, complete: Secondary | ICD-10-CM

## 2018-02-09 NOTE — Progress Notes (Signed)
Remote pacemaker transmission.   

## 2018-02-11 LAB — CUP PACEART REMOTE DEVICE CHECK
Battery Remaining Percentage: 95.5 %
Brady Statistic AS VP Percent: 63 %
Implantable Lead Implant Date: 20000201
Implantable Lead Location: 753860
Implantable Pulse Generator Implant Date: 20180328
Lead Channel Pacing Threshold Amplitude: 0.75 V
Lead Channel Pacing Threshold Pulse Width: 1 ms
Lead Channel Sensing Intrinsic Amplitude: 8.1 mV
Lead Channel Setting Pacing Amplitude: 2 V
Lead Channel Setting Sensing Sensitivity: 4 mV
MDC IDC MSMT BATTERY REMAINING LONGEVITY: 107 mo
MDC IDC MSMT BATTERY VOLTAGE: 2.99 V
MDC IDC MSMT LEADCHNL RA IMPEDANCE VALUE: 260 Ohm
MDC IDC MSMT LEADCHNL RA SENSING INTR AMPL: 2 mV
MDC IDC MSMT LEADCHNL RV IMPEDANCE VALUE: 450 Ohm
MDC IDC SESS DTM: 20191227001231
MDC IDC SET LEADCHNL RV PACING PULSEWIDTH: 1 ms
MDC IDC STAT BRADY AS VS PERCENT: 1 %
MDC IDC STAT BRADY RV PERCENT PACED: 99 %
Pulse Gen Serial Number: 7999856

## 2018-02-15 ENCOUNTER — Ambulatory Visit (INDEPENDENT_AMBULATORY_CARE_PROVIDER_SITE_OTHER): Payer: Medicare Other

## 2018-02-15 DIAGNOSIS — I442 Atrioventricular block, complete: Secondary | ICD-10-CM | POA: Diagnosis not present

## 2018-02-15 LAB — CUP PACEART REMOTE DEVICE CHECK
Battery Remaining Longevity: 107 mo
Battery Remaining Percentage: 95.5 %
Battery Voltage: 3.01 V
Brady Statistic AS VP Percent: 62 %
Brady Statistic AS VS Percent: 1 %
Brady Statistic RV Percent Paced: 99 %
Implantable Lead Implant Date: 20000201
Implantable Lead Model: 1368
Lead Channel Impedance Value: 280 Ohm
Lead Channel Impedance Value: 460 Ohm
Lead Channel Pacing Threshold Pulse Width: 1 ms
Lead Channel Sensing Intrinsic Amplitude: 2.1 mV
MDC IDC LEAD LOCATION: 753860
MDC IDC MSMT LEADCHNL RV PACING THRESHOLD AMPLITUDE: 0.75 V
MDC IDC MSMT LEADCHNL RV SENSING INTR AMPL: 8.1 mV
MDC IDC PG IMPLANT DT: 20180328
MDC IDC PG SERIAL: 7999856
MDC IDC SESS DTM: 20200102185638
MDC IDC SET LEADCHNL RV PACING AMPLITUDE: 2 V
MDC IDC SET LEADCHNL RV PACING PULSEWIDTH: 1 ms
MDC IDC SET LEADCHNL RV SENSING SENSITIVITY: 4 mV
Pulse Gen Model: 2272

## 2018-02-16 NOTE — Progress Notes (Signed)
Remote pacemaker transmission.   

## 2018-04-07 ENCOUNTER — Emergency Department (HOSPITAL_COMMUNITY): Payer: Medicare Other

## 2018-04-07 ENCOUNTER — Encounter (HOSPITAL_COMMUNITY): Payer: Self-pay

## 2018-04-07 ENCOUNTER — Inpatient Hospital Stay (HOSPITAL_COMMUNITY)
Admission: EM | Admit: 2018-04-07 | Discharge: 2018-04-17 | DRG: 190 | Disposition: A | Discharge status: Skilled Nursing Facility | Payer: Medicare Other | Attending: Internal Medicine | Admitting: Internal Medicine

## 2018-04-07 ENCOUNTER — Other Ambulatory Visit: Payer: Self-pay

## 2018-04-07 DIAGNOSIS — E785 Hyperlipidemia, unspecified: Secondary | ICD-10-CM | POA: Diagnosis present

## 2018-04-07 DIAGNOSIS — J9621 Acute and chronic respiratory failure with hypoxia: Secondary | ICD-10-CM | POA: Diagnosis present

## 2018-04-07 DIAGNOSIS — I2510 Atherosclerotic heart disease of native coronary artery without angina pectoris: Secondary | ICD-10-CM | POA: Diagnosis present

## 2018-04-07 DIAGNOSIS — L89102 Pressure ulcer of unspecified part of back, stage 2: Secondary | ICD-10-CM | POA: Diagnosis present

## 2018-04-07 DIAGNOSIS — R451 Restlessness and agitation: Secondary | ICD-10-CM | POA: Diagnosis present

## 2018-04-07 DIAGNOSIS — I495 Sick sinus syndrome: Secondary | ICD-10-CM | POA: Diagnosis present

## 2018-04-07 DIAGNOSIS — W19XXXA Unspecified fall, initial encounter: Secondary | ICD-10-CM | POA: Diagnosis present

## 2018-04-07 DIAGNOSIS — Z79890 Hormone replacement therapy (postmenopausal): Secondary | ICD-10-CM

## 2018-04-07 DIAGNOSIS — I442 Atrioventricular block, complete: Secondary | ICD-10-CM | POA: Diagnosis present

## 2018-04-07 DIAGNOSIS — Z8249 Family history of ischemic heart disease and other diseases of the circulatory system: Secondary | ICD-10-CM | POA: Diagnosis not present

## 2018-04-07 DIAGNOSIS — G4700 Insomnia, unspecified: Secondary | ICD-10-CM | POA: Diagnosis present

## 2018-04-07 DIAGNOSIS — I5032 Chronic diastolic (congestive) heart failure: Secondary | ICD-10-CM | POA: Diagnosis not present

## 2018-04-07 DIAGNOSIS — Z950 Presence of cardiac pacemaker: Secondary | ICD-10-CM

## 2018-04-07 DIAGNOSIS — Z7189 Other specified counseling: Secondary | ICD-10-CM | POA: Diagnosis not present

## 2018-04-07 DIAGNOSIS — Z90710 Acquired absence of both cervix and uterus: Secondary | ICD-10-CM | POA: Diagnosis not present

## 2018-04-07 DIAGNOSIS — I5033 Acute on chronic diastolic (congestive) heart failure: Secondary | ICD-10-CM | POA: Diagnosis present

## 2018-04-07 DIAGNOSIS — S51811A Laceration without foreign body of right forearm, initial encounter: Secondary | ICD-10-CM | POA: Diagnosis present

## 2018-04-07 DIAGNOSIS — E43 Unspecified severe protein-calorie malnutrition: Secondary | ICD-10-CM

## 2018-04-07 DIAGNOSIS — Z87891 Personal history of nicotine dependence: Secondary | ICD-10-CM

## 2018-04-07 DIAGNOSIS — J449 Chronic obstructive pulmonary disease, unspecified: Secondary | ICD-10-CM | POA: Diagnosis present

## 2018-04-07 DIAGNOSIS — I110 Hypertensive heart disease with heart failure: Secondary | ICD-10-CM | POA: Diagnosis present

## 2018-04-07 DIAGNOSIS — Z7902 Long term (current) use of antithrombotics/antiplatelets: Secondary | ICD-10-CM

## 2018-04-07 DIAGNOSIS — Z66 Do not resuscitate: Secondary | ICD-10-CM | POA: Diagnosis present

## 2018-04-07 DIAGNOSIS — I259 Chronic ischemic heart disease, unspecified: Secondary | ICD-10-CM | POA: Diagnosis present

## 2018-04-07 DIAGNOSIS — J441 Chronic obstructive pulmonary disease with (acute) exacerbation: Secondary | ICD-10-CM

## 2018-04-07 DIAGNOSIS — M810 Age-related osteoporosis without current pathological fracture: Secondary | ICD-10-CM | POA: Diagnosis present

## 2018-04-07 DIAGNOSIS — Z888 Allergy status to other drugs, medicaments and biological substances status: Secondary | ICD-10-CM | POA: Diagnosis not present

## 2018-04-07 DIAGNOSIS — F0391 Unspecified dementia with behavioral disturbance: Secondary | ICD-10-CM | POA: Diagnosis present

## 2018-04-07 DIAGNOSIS — I10 Essential (primary) hypertension: Secondary | ICD-10-CM | POA: Diagnosis not present

## 2018-04-07 DIAGNOSIS — Z681 Body mass index (BMI) 19 or less, adult: Secondary | ICD-10-CM | POA: Diagnosis not present

## 2018-04-07 DIAGNOSIS — R509 Fever, unspecified: Secondary | ICD-10-CM

## 2018-04-07 DIAGNOSIS — F05 Delirium due to known physiological condition: Secondary | ICD-10-CM | POA: Diagnosis present

## 2018-04-07 DIAGNOSIS — L8990 Pressure ulcer of unspecified site, unspecified stage: Secondary | ICD-10-CM

## 2018-04-07 DIAGNOSIS — Z79899 Other long term (current) drug therapy: Secondary | ICD-10-CM

## 2018-04-07 DIAGNOSIS — Z515 Encounter for palliative care: Secondary | ICD-10-CM | POA: Diagnosis not present

## 2018-04-07 DIAGNOSIS — R0902 Hypoxemia: Secondary | ICD-10-CM

## 2018-04-07 DIAGNOSIS — F03918 Unspecified dementia, unspecified severity, with other behavioral disturbance: Secondary | ICD-10-CM

## 2018-04-07 HISTORY — DX: Heart failure, unspecified: I50.9

## 2018-04-07 HISTORY — DX: Unspecified dementia, unspecified severity, without behavioral disturbance, psychotic disturbance, mood disturbance, and anxiety: F03.90

## 2018-04-07 HISTORY — DX: Essential (primary) hypertension: I10

## 2018-04-07 LAB — CBC WITH DIFFERENTIAL/PLATELET
ABS IMMATURE GRANULOCYTES: 0.03 10*3/uL (ref 0.00–0.07)
BASOS PCT: 1 %
Basophils Absolute: 0 10*3/uL (ref 0.0–0.1)
EOS ABS: 0.1 10*3/uL (ref 0.0–0.5)
EOS PCT: 1 %
HCT: 41.1 % (ref 36.0–46.0)
Hemoglobin: 13 g/dL (ref 12.0–15.0)
Immature Granulocytes: 1 %
Lymphocytes Relative: 9 %
Lymphs Abs: 0.6 10*3/uL — ABNORMAL LOW (ref 0.7–4.0)
MCH: 31.4 pg (ref 26.0–34.0)
MCHC: 31.6 g/dL (ref 30.0–36.0)
MCV: 99.3 fL (ref 80.0–100.0)
MONO ABS: 0.7 10*3/uL (ref 0.1–1.0)
MONOS PCT: 11 %
Neutro Abs: 5 10*3/uL (ref 1.7–7.7)
Neutrophils Relative %: 77 %
Platelets: 166 10*3/uL (ref 150–400)
RBC: 4.14 MIL/uL (ref 3.87–5.11)
RDW: 13.9 % (ref 11.5–15.5)
WBC: 6.4 10*3/uL (ref 4.0–10.5)
nRBC: 0 % (ref 0.0–0.2)

## 2018-04-07 LAB — URINALYSIS, ROUTINE W REFLEX MICROSCOPIC
BILIRUBIN URINE: NEGATIVE
Glucose, UA: NEGATIVE mg/dL
Hgb urine dipstick: NEGATIVE
Ketones, ur: NEGATIVE mg/dL
Leukocytes,Ua: NEGATIVE
Nitrite: NEGATIVE
Protein, ur: NEGATIVE mg/dL
Specific Gravity, Urine: 1.012 (ref 1.005–1.030)
pH: 6 (ref 5.0–8.0)

## 2018-04-07 LAB — INFLUENZA PANEL BY PCR (TYPE A & B)
Influenza A By PCR: NEGATIVE
Influenza B By PCR: NEGATIVE

## 2018-04-07 LAB — COMPREHENSIVE METABOLIC PANEL
ALK PHOS: 105 U/L (ref 38–126)
ALT: 26 U/L (ref 0–44)
AST: 36 U/L (ref 15–41)
Albumin: 4.5 g/dL (ref 3.5–5.0)
Anion gap: 11 (ref 5–15)
BUN: 31 mg/dL — AB (ref 8–23)
CHLORIDE: 103 mmol/L (ref 98–111)
CO2: 27 mmol/L (ref 22–32)
CREATININE: 0.97 mg/dL (ref 0.44–1.00)
Calcium: 9.2 mg/dL (ref 8.9–10.3)
GFR, EST NON AFRICAN AMERICAN: 52 mL/min — AB (ref >60)
Glucose, Bld: 104 mg/dL — ABNORMAL HIGH (ref 70–99)
Potassium: 3.5 mmol/L (ref 3.5–5.1)
Sodium: 141 mmol/L (ref 135–145)
Total Bilirubin: 0.9 mg/dL (ref 0.3–1.2)
Total Protein: 7.7 g/dL (ref 6.5–8.1)

## 2018-04-07 LAB — PROTIME-INR
INR: 0.96
Prothrombin Time: 12.6 seconds (ref 11.4–15.2)

## 2018-04-07 LAB — LACTIC ACID, PLASMA
LACTIC ACID, VENOUS: 1 mmol/L (ref 0.5–1.9)
LACTIC ACID, VENOUS: 1.5 mmol/L (ref 0.5–1.9)

## 2018-04-07 LAB — CBG MONITORING, ED: Glucose-Capillary: 94 mg/dL (ref 70–99)

## 2018-04-07 LAB — TROPONIN I: Troponin I: 0.03 ng/mL (ref <0.03)

## 2018-04-07 MED ORDER — SODIUM CHLORIDE (PF) 0.9 % IJ SOLN
INTRAMUSCULAR | Status: AC
  Administered 2018-04-07: 10 mL
  Filled 2018-04-07: qty 50

## 2018-04-07 MED ORDER — IPRATROPIUM-ALBUTEROL 0.5-2.5 (3) MG/3ML IN SOLN: 3.0000 mL | Freq: Four times a day (QID) | RESPIRATORY_TRACT | Status: DC

## 2018-04-07 MED ORDER — METHYLPREDNISOLONE SODIUM SUCC 125 MG IJ SOLR
80.0000 mg | Freq: Four times a day (QID) | INTRAMUSCULAR | Status: DC
  Administered 2018-04-07 – 2018-04-10 (×8): 80 mg via INTRAVENOUS
  Filled 2018-04-07 (×9): qty 2

## 2018-04-07 MED ORDER — ONDANSETRON HCL 4 MG PO TABS: 4.0000 mg | ORAL_TABLET | Freq: Four times a day (QID) | ORAL | Status: DC | PRN

## 2018-04-07 MED ORDER — HYDROCODONE-ACETAMINOPHEN 5-325 MG PO TABS
1.0000 | ORAL_TABLET | Freq: Four times a day (QID) | ORAL | Status: DC | PRN
  Administered 2018-04-07 – 2018-04-11 (×3): 1 via ORAL
  Filled 2018-04-07 (×4): qty 1

## 2018-04-07 MED ORDER — POTASSIUM CHLORIDE ER 10 MEQ PO TBCR
10.0000 meq | EXTENDED_RELEASE_TABLET | Freq: Every day | ORAL | Status: DC
  Administered 2018-04-08 – 2018-04-17 (×10): 10 meq via ORAL
  Filled 2018-04-07 (×19): qty 1

## 2018-04-07 MED ORDER — MIRTAZAPINE 15 MG PO TABS
7.5000 mg | ORAL_TABLET | Freq: Every day | ORAL | Status: DC
  Administered 2018-04-07 – 2018-04-16 (×9): 7.5 mg via ORAL
  Filled 2018-04-07 (×10): qty 1

## 2018-04-07 MED ORDER — RISPERIDONE 1 MG PO TABS
2.5000 mg | ORAL_TABLET | Freq: Two times a day (BID) | ORAL | Status: DC | PRN
  Administered 2018-04-08: 2.5 mg via ORAL
  Filled 2018-04-07 (×2): qty 2

## 2018-04-07 MED ORDER — SODIUM CHLORIDE 0.9 % IV SOLN
100.0000 mg | Freq: Two times a day (BID) | INTRAVENOUS | Status: DC
  Administered 2018-04-08 – 2018-04-10 (×6): 100 mg via INTRAVENOUS
  Filled 2018-04-07 (×6): qty 100

## 2018-04-07 MED ORDER — ENOXAPARIN SODIUM 30 MG/0.3ML ~~LOC~~ SOLN
30.0000 mg | Freq: Every day | SUBCUTANEOUS | Status: DC
  Administered 2018-04-09 – 2018-04-17 (×8): 30 mg via SUBCUTANEOUS
  Filled 2018-04-07 (×10): qty 0.3

## 2018-04-07 MED ORDER — SIMVASTATIN 20 MG PO TABS
20.0000 mg | ORAL_TABLET | Freq: Every day | ORAL | Status: DC
  Administered 2018-04-09 – 2018-04-16 (×6): 20 mg via ORAL
  Filled 2018-04-07 (×7): qty 1

## 2018-04-07 MED ORDER — MELATONIN 5 MG PO TABS
5.0000 mg | ORAL_TABLET | Freq: Every evening | ORAL | Status: DC | PRN
  Filled 2018-04-07: qty 1

## 2018-04-07 MED ORDER — IOPAMIDOL (ISOVUE-370) INJECTION 76%
100.0000 mL | Freq: Once | INTRAVENOUS | Status: AC | PRN
  Administered 2018-04-07: 100 mL via INTRAVENOUS

## 2018-04-07 MED ORDER — SODIUM CHLORIDE 0.9% FLUSH
3.0000 mL | Freq: Two times a day (BID) | INTRAVENOUS | Status: DC
  Administered 2018-04-07 – 2018-04-14 (×12): 3 mL via INTRAVENOUS

## 2018-04-07 MED ORDER — FUROSEMIDE 20 MG PO TABS
20.0000 mg | ORAL_TABLET | Freq: Every day | ORAL | Status: DC
  Administered 2018-04-08 – 2018-04-17 (×10): 20 mg via ORAL
  Filled 2018-04-07 (×10): qty 1

## 2018-04-07 MED ORDER — IPRATROPIUM-ALBUTEROL 0.5-2.5 (3) MG/3ML IN SOLN
3.0000 mL | Freq: Two times a day (BID) | RESPIRATORY_TRACT | Status: DC
  Administered 2018-04-08 – 2018-04-14 (×11): 3 mL via RESPIRATORY_TRACT
  Filled 2018-04-07 (×12): qty 3

## 2018-04-07 MED ORDER — SODIUM CHLORIDE 0.9 % IV SOLN: 250.0000 mL | INTRAVENOUS | Status: DC | PRN

## 2018-04-07 MED ORDER — ATENOLOL 50 MG PO TABS
100.0000 mg | ORAL_TABLET | Freq: Every day | ORAL | Status: DC
  Administered 2018-04-08 – 2018-04-17 (×10): 100 mg via ORAL
  Filled 2018-04-07 (×10): qty 2

## 2018-04-07 MED ORDER — IPRATROPIUM-ALBUTEROL 0.5-2.5 (3) MG/3ML IN SOLN
3.0000 mL | Freq: Once | RESPIRATORY_TRACT | Status: AC
  Administered 2018-04-07: 3 mL via RESPIRATORY_TRACT
  Filled 2018-04-07: qty 3

## 2018-04-07 MED ORDER — IOPAMIDOL (ISOVUE-370) INJECTION 76%
INTRAVENOUS | Status: AC
  Filled 2018-04-07: qty 100

## 2018-04-07 MED ORDER — CLOPIDOGREL BISULFATE 75 MG PO TABS
75.0000 mg | ORAL_TABLET | Freq: Every day | ORAL | Status: DC
  Administered 2018-04-08 – 2018-04-17 (×10): 75 mg via ORAL
  Filled 2018-04-07 (×10): qty 1

## 2018-04-07 MED ORDER — ACETAMINOPHEN 325 MG PO TABS
650.0000 mg | ORAL_TABLET | Freq: Four times a day (QID) | ORAL | Status: DC | PRN
  Administered 2018-04-13: 650 mg via ORAL
  Filled 2018-04-07: qty 2

## 2018-04-07 MED ORDER — SODIUM CHLORIDE 0.9% FLUSH: 3.0000 mL | INTRAVENOUS | Status: DC | PRN

## 2018-04-07 MED ORDER — LISINOPRIL 20 MG PO TABS
40.0000 mg | ORAL_TABLET | Freq: Every day | ORAL | Status: DC
  Administered 2018-04-08 – 2018-04-17 (×10): 40 mg via ORAL
  Filled 2018-04-07 (×10): qty 2

## 2018-04-07 MED ORDER — HYDRALAZINE HCL 20 MG/ML IJ SOLN
10.0000 mg | Freq: Four times a day (QID) | INTRAMUSCULAR | Status: DC | PRN
  Administered 2018-04-07 – 2018-04-12 (×6): 10 mg via INTRAVENOUS
  Filled 2018-04-07 (×6): qty 1

## 2018-04-07 MED ORDER — ONDANSETRON HCL 4 MG/2ML IJ SOLN: 4.0000 mg | Freq: Four times a day (QID) | INTRAMUSCULAR | Status: DC | PRN

## 2018-04-07 MED ORDER — ACETAMINOPHEN 325 MG PO TABS
650.0000 mg | ORAL_TABLET | Freq: Once | ORAL | Status: AC
  Administered 2018-04-07: 650 mg via ORAL
  Filled 2018-04-07: qty 2

## 2018-04-07 MED ORDER — DONEPEZIL HCL 5 MG PO TABS
5.0000 mg | ORAL_TABLET | Freq: Every day | ORAL | Status: DC
  Administered 2018-04-07 – 2018-04-16 (×9): 5 mg via ORAL
  Filled 2018-04-07 (×10): qty 1

## 2018-04-07 NOTE — ED Notes (Signed)
2nd set of cultures obtained 1715 Left AC

## 2018-04-07 NOTE — ED Notes (Signed)
Patient transported to X-ray 

## 2018-04-07 NOTE — ED Notes (Signed)
Date and time results received: 04/07/18 1822 (use smartphrase ".now" to insert current time)  Test: Troponin Critical Value: 0.03  Name of Provider Notified: Lucrezia Europe, RN  Orders Received? Or Actions Taken?: Orders Received - See Orders for details

## 2018-04-07 NOTE — ED Notes (Signed)
Report given and pt will be ready for transport to floor once breathing treatment finishes.
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
## 2018-04-07 NOTE — Progress Notes (Signed)
Pharmacy Brief note: Lovenox    Wt = 41 kg, CrCl~26 ml/min  Rx adjusted Lovenox to 30 mg daily in pt with wt < 45 kg and CrCl<30 ml/min.   Thanks Lorenza Evangelist 04/07/2018 10:11 PM

## 2018-04-07 NOTE — ED Notes (Signed)
ED TO INPATIENT HANDOFF REPORT  Name/Age/Gender Kathy Solomon 85 y.o. female  Code Status Code Status History    Date Active Date Inactive Code Status Order ID Comments User Context   01/14/2016 1328 01/17/2016 1714 DNR [id number]  Narda Bonds, MD Inpatient    Questions for Most Recent Historical Code Status (Order [id number])    Question Answer Comment   In the event of cardiac or respiratory ARREST Do not call a "code blue"    In the event of cardiac or respiratory ARREST Do not perform Intubation, CPR, defibrillation or ACLS    In the event of cardiac or respiratory ARREST Use medication by any route, position, wound care, and other measures to relive pain and suffering. May use oxygen, suction and manual treatment of airway obstruction as needed for comfort.       Home/SNF/Other Nursing Home  Chief Complaint Back Pain due to Unwitnessed Fall  Level of Care/Admitting Diagnosis ED Disposition    ED Disposition Condition Comment   Admit  Hospital Area: [facility] [[id number]]  Level of Care: Telemetry [5]  Admit to tele based on following criteria: Other see comments  Comments: resp failure  Diagnosis: Acute on chronic respiratory failure with hypoxemia [facility]) [[id number]]  Admitting Physician: Rometta Emery [[id number]]  Attending Physician: Rometta Emery [[id number]]  Estimated length of stay: past midnight tomorrow  Certification:: I certify this patient will need inpatient services for at least 2 midnights  PT Class (Do Not Modify): Inpatient [101]  PT Acc Code (Do Not Modify): Private [1]       Medical History Past Medical History:  Diagnosis Date  . CHF (congestive heart failure) (HCC)   . Complete heart block (HCC)   . Dementia (HCC)   . Heart murmur   . Hypertension   . Ischemic heart disease   . Left carotid bruit   . Pacemaker - [employer]    VDD Lead     Allergies Allergies  Allergen Reactions  . Clonidine Derivatives Nausea Only,  Anxiety and Other (See Comments)     "feel like a zombie"    IV Location/Drains/Wounds Patient Lines/Drains/Airways Status   Active Line/Drains/Airways    Name:   Placement date:   Placement time:   Site:   Days:   Peripheral IV 04/07/18 Left Forearm   04/07/18    1921    Forearm   less than 1   External Urinary Catheter   04/07/18    1710    -   less than 1          Labs/Imaging Results for orders placed or performed during the hospital encounter of 04/07/18 (from the past 48 hour(s))  Comprehensive metabolic panel     Status: Abnormal   Collection Time: 04/07/18  4:32 PM  Result Value Ref Range   Sodium 141 135 - 145 mmol/L   Potassium 3.5 3.5 - 5.1 mmol/L   Chloride 103 98 - 111 mmol/L   CO2 27 22 - 32 mmol/L   Glucose, Bld 104 (H) 70 - 99 mg/dL   BUN 31 (H) 8 - 23 mg/dL   Creatinine, Ser 6.31 0.44 - 1.00 mg/dL   Calcium 9.2 8.9 - 49.7 mg/dL   Total Protein 7.7 6.5 - 8.1 g/dL   Albumin 4.5 3.5 - 5.0 g/dL   AST 36 15 - 41 U/L   ALT 26 0 - 44 U/L   Alkaline Phosphatase 105 38 - 126 U/L  Total Bilirubin 0.9 0.3 - 1.2 mg/dL   GFR calc non Af Amer 52 (L) >60 mL/min   GFR calc Af Amer >60 >60 mL/min   Anion gap 11 5 - 15    Comment: Performed at [facility], [address] [place], [place] [address]  Lactic acid, plasma     Status: None   Collection Time: 04/07/18  4:32 PM  Result Value Ref Range   Lactic Acid, Venous 1.0 0.5 - 1.9 mmol/L    Comment: Performed at [facility], [address] [place], [place] [address]  CBC with Differential     Status: Abnormal   Collection Time: 04/07/18  4:32 PM  Result Value Ref Range   WBC 6.4 4.0 - 10.5 K/uL   RBC 4.14 3.87 - 5.11 MIL/uL   Hemoglobin 13.0 12.0 - 15.0 g/dL   HCT 02.3 34.3 - 56.8 %   MCV 99.3 80.0 - 100.0 fL   MCH 31.4 26.0 - 34.0 pg   MCHC 31.6 30.0 - 36.0 g/dL   RDW 61.6 83.7 - 29.0 %   Platelets 166 150 - 400 K/uL   nRBC 0.0 0.0 - 0.2 %   Neutrophils Relative % 77 %    Neutro Abs 5.0 1.7 - 7.7 K/uL   Lymphocytes Relative 9 %   Lymphs Abs 0.6 (L) 0.7 - 4.0 K/uL   Monocytes Relative 11 %   Monocytes Absolute 0.7 0.1 - 1.0 K/uL   Eosinophils Relative 1 %   Eosinophils Absolute 0.1 0.0 - 0.5 K/uL   Basophils Relative 1 %   Basophils Absolute 0.0 0.0 - 0.1 K/uL   Immature Granulocytes 1 %   Abs Immature Granulocytes 0.03 0.00 - 0.07 K/uL    Comment: Performed at [facility], [address], [place], [place] [address]  Protime-INR     Status: None   Collection Time: 04/07/18  4:32 PM  Result Value Ref Range   Prothrombin Time 12.6 11.4 - 15.2 seconds   INR 0.96     Comment: Performed at [facility], [address] [place], [place] [address]  Urinalysis, Routine w reflex microscopic     Status: None   Collection Time: 04/07/18  4:32 PM  Result Value Ref Range   Color, Urine YELLOW YELLOW   APPearance CLEAR CLEAR   Specific Gravity, Urine 1.012 1.005 - 1.030   pH 6.0 5.0 - 8.0   Glucose, UA NEGATIVE NEGATIVE mg/dL   Hgb urine dipstick NEGATIVE NEGATIVE   Bilirubin Urine NEGATIVE NEGATIVE   Ketones, ur NEGATIVE NEGATIVE mg/dL   Protein, ur NEGATIVE NEGATIVE mg/dL   Nitrite NEGATIVE NEGATIVE   Leukocytes,Ua NEGATIVE NEGATIVE    Comment: Performed at [facility], [address] [place], [place] [address]  Troponin I - ONCE - STAT     Status: Abnormal   Collection Time: 04/07/18  4:32 PM  Result Value Ref Range   Troponin I 0.03 (HH) <0.03 ng/mL    Comment: CRITICAL RESULT CALLED TO, READ BACK BY AND VERIFIED WITH: CLAPP,S RN @1822  ON 04/07/2018 JACKSON,K Performed at [facility], [address], [place], [place] [address]   CBG monitoring, ED     Status: None   Collection Time: 04/07/18  5:14 PM  Result Value Ref Range   Glucose-Capillary 94 70 - 99 mg/dL  Influenza panel by PCR (type A & B)     Status: None   Collection Time: 04/07/18  5:29 PM  Result Value Ref  Range   Influenza A By PCR NEGATIVE NEGATIVE   Influenza B By PCR NEGATIVE NEGATIVE    Comment: (NOTE) The Xpert Xpress Flu assay is intended as an aid in the diagnosis of  influenza and should not be used as a sole basis for treatment.  This  assay is FDA approved for nasopharyngeal swab specimens only. Nasal  washings and aspirates are unacceptable for Xpert Xpress Flu testing. Performed at [facility], [address] [place], [place] [address]   Lactic acid, plasma     Status: None   Collection Time: 04/07/18  7:26 PM  Result Value Ref Range   Lactic Acid, Venous 1.5 0.5 - 1.9 mmol/L    Comment: Performed at [facility], [address] [place], [place] [address]   Dg Chest 2 View  Result Date: 04/07/2018 CLINICAL DATA:  Suspected sepsis. EXAM: CHEST - 2 VIEW COMPARISON:  01/14/2016 FINDINGS: Right pacer remains in place, unchanged. Cardiomegaly with vascular congestion. Increased interstitial markings throughout the lungs, favor chronic interstitial lung disease/fibrosis. Findings are similar to prior study. No confluent airspace opacities or effusions. IMPRESSION: Cardiomegaly. Chronic coarsened interstitial opacities throughout the lungs, favor chronic interstitial lung disease/fibrosis. No definite acute cardiopulmonary disease. Densely calcified aortic arch. Electronically Signed   By: Charlett Nose M.D.   On: 04/07/2018 19:21   Dg Lumbar Spine Complete  Result Date: 04/07/2018 CLINICAL DATA:  Fall EXAM: LUMBAR SPINE - COMPLETE 4+ VIEW COMPARISON:  None. FINDINGS: Diffuse osteopenia. No fracture or malalignment. Degenerative disc disease changes most pronounced at L5-S1 with disc space narrowing. Diffuse degenerative facet disease throughout the lumbar spine. SI joints symmetric and unremarkable. Aortic atherosclerosis without visible aneurysm. IMPRESSION: Diffuse osteopenia.  No acute bony abnormality. Electronically Signed   By: Charlett Nose  M.D.   On: 04/07/2018 19:21   Dg Pelvis 1-2 Views  Result Date: 04/07/2018 CLINICAL DATA:  Fall EXAM: PELVIS - 1-2 VIEW COMPARISON:  None. FINDINGS: Diffuse osteopenia. Early degenerative changes in the hips bilaterally. No acute bony abnormality. Specifically, no fracture, subluxation, or dislocation. IMPRESSION: No acute bony abnormality. Electronically Signed   By: Charlett Nose M.D.   On: 04/07/2018 19:22   Ct Angio Chest Pe W And/or Wo Contrast  Result Date: 04/07/2018 CLINICAL DATA:  Fall EXAM: CT ANGIOGRAPHY CHEST WITH CONTRAST TECHNIQUE: Multidetector CT imaging of the chest was performed using the standard protocol during bolus administration of intravenous contrast. Multiplanar CT image reconstructions and MIPs were obtained to evaluate the vascular anatomy. CONTRAST:  ISOVUE-370 IOPAMIDOL (ISOVUE-370) INJECTION 76% COMPARISON:  Chest x-ray today FINDINGS: Cardiovascular: No filling defects in the pulmonary arteries to suggest pulmonary emboli. Diffuse aortic atherosclerosis. No aneurysm. Main pulmonary artery prominent up to 3.9 cm with central pulmonary arteries also prominent compatible with pulmonary arterial hypertension. Heart is enlarged. Diffuse coronary artery calcifications. Mediastinum/Nodes: No mediastinal, hilar, or axillary adenopathy. Lungs/Pleura: Moderate emphysema. Peripheral interstitial prominence compatible with chronic interstitial lung disease. No confluent opacities or effusions. Dependent linear densities in the lower lobes, likely dependent atelectasis or scarring. Upper Abdomen: Imaging into the upper abdomen shows no acute findings. Musculoskeletal: Chest wall soft tissues are unremarkable. No acute bony abnormality. Review of the MIP images confirms the above findings. IMPRESSION: No evidence of pulmonary embolus. Cardiomegaly.  Evidence of pulmonary arterial hypertension. COPD/chronic interstitial lung disease. Aortic Atherosclerosis (ICD10-I70.0) and Emphysema  (ICD10-J43.9). Electronically Signed   By: Charlett Nose M.D.   On: 04/07/2018  20:18   EKG Interpretation  Date/Time:  Saturday April 07 2018 16:38:15 EST Ventricular Rate:  69 PR Interval:    QRS Duration: 171 QT Interval:  493 QTC Calculation: 529 R Axis:   -79 Text Interpretation:  Atrial-sensed ventricular-paced complexes No further analysis attempted due to paced rhythm No significant change since last tracing Confirmed by Jacalyn Lefevre [phone]) on 04/07/2018 5:35:26 PM   Pending Labs Unresulted Labs (From admission, onward)    Start     Ordered   04/07/18 1639  Urine culture  ONCE - STAT,   STAT     04/07/18 1638   04/07/18 1632  Culture, blood (Routine x 2)  BLOOD CULTURE X 2,   STAT     04/07/18 1631   Signed and Held  CBC  (enoxaparin (LOVENOX)    CrCl >/= 30 ml/min)  Once,   R    Comments:  Baseline for enoxaparin therapy IF NOT ALREADY DRAWN.  Notify MD if PLT < 100 K.    Signed and Held   Signed and Held  Creatinine, serum  (enoxaparin (LOVENOX)    CrCl >/= 30 ml/min)  Once,   R    Comments:  Baseline for enoxaparin therapy IF NOT ALREADY DRAWN.    Signed and Held   Signed and Held  Creatinine, serum  (enoxaparin (LOVENOX)    CrCl >/= 30 ml/min)  Weekly,   R    Comments:  while on enoxaparin therapy    Signed and Held   Signed and Held  Comprehensive metabolic panel  Tomorrow morning,   R     Signed and Held   Signed and Held  CBC  Tomorrow morning,   R     Signed and Held          Vitals/Pain Today's Vitals   04/07/18 1855 04/07/18 1928 04/07/18 2004 04/07/18 2030  BP:   (!) 198/76 (!) 163/71  Pulse:   62 60  Resp:   18 18  Temp:  98.2 F (36.8 C)    TempSrc:  Oral    SpO2:   96% 94%  Weight:      Height:      PainSc: 0-No pain       Isolation Precautions No active isolations  Medications Medications  sodium chloride (PF) 0.9 % injection (has no administration in time range)  iopamidol (ISOVUE-370) 76 % injection (has no administration in  time range)  ipratropium-albuterol (DUONEB) 0.5-2.5 (3) MG/3ML nebulizer solution 3 mL (has no administration in time range)  acetaminophen (TYLENOL) tablet 650 mg (650 mg Oral Given 04/07/18 1748)  iopamidol (ISOVUE-370) 76 % injection 100 mL (100 mLs Intravenous Contrast Given 04/07/18 1949)    Mobility walks with device

## 2018-04-07 NOTE — ED Notes (Signed)
ED Provider at bedside. 

## 2018-04-07 NOTE — ED Notes (Signed)
Patient is being cleaned right now. New brief being placed on after cleaning.

## 2018-04-07 NOTE — ED Notes (Signed)
Bed: WA17 Expected date:  Expected time:  Means of arrival:  Comments: 85 yo back pain, fall

## 2018-04-08 LAB — COMPREHENSIVE METABOLIC PANEL
ALT: 24 U/L (ref 0–44)
AST: 29 U/L (ref 15–41)
Albumin: 3.6 g/dL (ref 3.5–5.0)
Alkaline Phosphatase: 76 U/L (ref 38–126)
Anion gap: 9 (ref 5–15)
BUN: 24 mg/dL — ABNORMAL HIGH (ref 8–23)
CALCIUM: 8.8 mg/dL — AB (ref 8.9–10.3)
CO2: 26 mmol/L (ref 22–32)
Chloride: 106 mmol/L (ref 98–111)
Creatinine, Ser: 0.79 mg/dL (ref 0.44–1.00)
GFR calc non Af Amer: >60 mL/min (ref >60)
Glucose, Bld: 146 mg/dL — ABNORMAL HIGH (ref 70–99)
Potassium: 3.1 mmol/L — ABNORMAL LOW (ref 3.5–5.1)
Sodium: 141 mmol/L (ref 135–145)
Total Bilirubin: 0.6 mg/dL (ref 0.3–1.2)
Total Protein: 6.4 g/dL — ABNORMAL LOW (ref 6.5–8.1)

## 2018-04-08 LAB — CBC
HCT: 36.9 % (ref 36.0–46.0)
Hemoglobin: 11.6 g/dL — ABNORMAL LOW (ref 12.0–15.0)
MCH: 31.4 pg (ref 26.0–34.0)
MCHC: 31.4 g/dL (ref 30.0–36.0)
MCV: 100 fL (ref 80.0–100.0)
Platelets: 149 10*3/uL — ABNORMAL LOW (ref 150–400)
RBC: 3.69 MIL/uL — ABNORMAL LOW (ref 3.87–5.11)
RDW: 13.8 % (ref 11.5–15.5)
WBC: 7.1 10*3/uL (ref 4.0–10.5)
nRBC: 0 % (ref 0.0–0.2)

## 2018-04-08 MED ORDER — POTASSIUM CHLORIDE CRYS ER 20 MEQ PO TBCR
40.0000 meq | EXTENDED_RELEASE_TABLET | Freq: Two times a day (BID) | ORAL | Status: AC
  Administered 2018-04-08: 40 meq via ORAL
  Filled 2018-04-08 (×4): qty 2

## 2018-04-08 MED ORDER — MAGNESIUM SULFATE 2 GM/50ML IV SOLN
2.0000 g | Freq: Once | INTRAVENOUS | Status: AC
  Administered 2018-04-08: 2 g via INTRAVENOUS
  Filled 2018-04-08: qty 50

## 2018-04-08 NOTE — Progress Notes (Addendum)
Pt. Refuses to take [facility] treatment and is quite aggressive and agitated. Medication had already been scanned, opened and placed in nebulizer cup before patient refused.
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
## 2018-04-08 NOTE — Progress Notes (Signed)
Patient refuses all po med.  kicking and cursing staff. Just want to left alone to sleep. Sleeping at this time in no acute episode

## 2018-04-09 LAB — BASIC METABOLIC PANEL
Anion gap: 7 (ref 5–15)
BUN: 40 mg/dL — ABNORMAL HIGH (ref 8–23)
CO2: 25 mmol/L (ref 22–32)
CREATININE: 0.89 mg/dL (ref 0.44–1.00)
Calcium: 8.6 mg/dL — ABNORMAL LOW (ref 8.9–10.3)
Chloride: 109 mmol/L (ref 98–111)
GFR calc Af Amer: >60 mL/min (ref >60)
GFR calc non Af Amer: 58 mL/min — ABNORMAL LOW (ref >60)
Glucose, Bld: 149 mg/dL — ABNORMAL HIGH (ref 70–99)
Potassium: 4.4 mmol/L (ref 3.5–5.1)
Sodium: 141 mmol/L (ref 135–145)

## 2018-04-09 LAB — URINE CULTURE: Culture: NO GROWTH

## 2018-04-09 LAB — CBC
HCT: 37.5 % (ref 36.0–46.0)
Hemoglobin: 11.5 g/dL — ABNORMAL LOW (ref 12.0–15.0)
MCH: 31.3 pg (ref 26.0–34.0)
MCHC: 30.7 g/dL (ref 30.0–36.0)
MCV: 101.9 fL — ABNORMAL HIGH (ref 80.0–100.0)
Platelets: 147 10*3/uL — ABNORMAL LOW (ref 150–400)
RBC: 3.68 MIL/uL — ABNORMAL LOW (ref 3.87–5.11)
RDW: 13.6 % (ref 11.5–15.5)
WBC: 8.5 10*3/uL (ref 4.0–10.5)
nRBC: 0 % (ref 0.0–0.2)

## 2018-04-09 LAB — MRSA PCR SCREENING: MRSA by PCR: NEGATIVE

## 2018-04-09 MED ORDER — QUETIAPINE FUMARATE 25 MG PO TABS
25.0000 mg | ORAL_TABLET | Freq: Two times a day (BID) | ORAL | Status: DC
  Administered 2018-04-09 – 2018-04-17 (×16): 25 mg via ORAL
  Filled 2018-04-09 (×16): qty 1

## 2018-04-09 NOTE — Progress Notes (Signed)
Spoke with daughter Ashby Dawes sveral times today and request follow up from MD, MD made aware: Daughter's numbers: [phone] or cell [phone].
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
## 2018-04-09 NOTE — Progress Notes (Signed)
Pt confused, and made several attempts to get out of bed. Pt bed alrm in moderate sensitive setting. Pulling at O2 lines and legs across bed rails. Reposition and returned to bed. CN notified and have requested if Safety sitter available. Pt too confused to listen to telesitter prompts. Will cont to monitor safety. SRP, RN

## 2018-04-09 NOTE — Progress Notes (Signed)
Pt restless throughout the day. Pt ordered Seroquel. SRP,RN

## 2018-04-10 MED ORDER — DOXYCYCLINE HYCLATE 100 MG PO TABS
100.0000 mg | ORAL_TABLET | Freq: Two times a day (BID) | ORAL | Status: DC
  Administered 2018-04-10 – 2018-04-17 (×14): 100 mg via ORAL
  Filled 2018-04-10 (×14): qty 1

## 2018-04-10 MED ORDER — PANTOPRAZOLE SODIUM 40 MG PO TBEC
40.0000 mg | DELAYED_RELEASE_TABLET | Freq: Every day | ORAL | Status: DC
  Administered 2018-04-10: 40 mg via ORAL
  Filled 2018-04-10: qty 1

## 2018-04-10 MED ORDER — METHYLPREDNISOLONE SODIUM SUCC 125 MG IJ SOLR
60.0000 mg | Freq: Two times a day (BID) | INTRAMUSCULAR | Status: DC
  Administered 2018-04-10: 60 mg via INTRAVENOUS
  Filled 2018-04-10: qty 2

## 2018-04-10 MED ORDER — PREDNISONE 20 MG PO TABS
40.0000 mg | ORAL_TABLET | Freq: Every day | ORAL | Status: DC
  Administered 2018-04-11 – 2018-04-17 (×7): 40 mg via ORAL
  Filled 2018-04-10 (×7): qty 2

## 2018-04-10 MED ORDER — LORAZEPAM 2 MG/ML IJ SOLN
1.0000 mg | INTRAMUSCULAR | Status: DC | PRN
  Administered 2018-04-10 – 2018-04-12 (×4): 1 mg via INTRAVENOUS
  Filled 2018-04-10 (×4): qty 1

## 2018-04-10 MED ORDER — FAMOTIDINE 20 MG PO TABS
20.0000 mg | ORAL_TABLET | Freq: Every day | ORAL | Status: DC
  Administered 2018-04-11 – 2018-04-17 (×7): 20 mg via ORAL
  Filled 2018-04-10 (×7): qty 1

## 2018-04-10 NOTE — Care Management Important Message (Signed)
Important Message  Patient Details  Name: Kathy Solomon MRN: [id number] Date of Birth: Apr 06, 1927   Medicare Important Message Given:  Yes    Caren Macadam 04/10/2018, 12:18 PMImportant Message  Patient Details  Name: Kathy Solomon MRN: [id number] Date of Birth: [birth year]   Medicare Important Message Given:  Yes    Caren Macadam 04/10/2018, 12:18 PM
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
## 2018-04-11 DIAGNOSIS — J441 Chronic obstructive pulmonary disease with (acute) exacerbation: Principal | ICD-10-CM

## 2018-04-11 DIAGNOSIS — I10 Essential (primary) hypertension: Secondary | ICD-10-CM

## 2018-04-11 MED ORDER — OLANZAPINE 5 MG PO TABS
2.5000 mg | ORAL_TABLET | Freq: Every day | ORAL | Status: DC
  Administered 2018-04-11 – 2018-04-16 (×6): 2.5 mg via ORAL
  Filled 2018-04-11 (×6): qty 1

## 2018-04-11 MED ORDER — OLANZAPINE 5 MG PO TABS: 2.5000 mg | ORAL_TABLET | Freq: Every day | ORAL | Status: DC | PRN
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
## 2018-04-12 LAB — CULTURE, BLOOD (ROUTINE X 2)
Culture: NO GROWTH
Culture: NO GROWTH
Special Requests: ADEQUATE
Special Requests: ADEQUATE

## 2018-04-12 MED ORDER — ENSURE ENLIVE PO LIQD
237.0000 mL | Freq: Two times a day (BID) | ORAL | Status: DC
  Administered 2018-04-12 – 2018-04-17 (×10): 237 mL via ORAL
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
## 2018-04-12 NOTE — Plan of Care (Signed)
Pt drank an Ensure on day shift and ate a Magic cup. Pt had good urine OP on day shift and had a small BM. Her abdomen is no longer taut . Bowel sounds are no longer hypo and abd is no longer tender. Pt is w/o a sitter in her room. She has turned sideways in her bed twice so far this shift.
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
## 2018-04-12 NOTE — Progress Notes (Signed)
Initial Nutrition Assessment  DOCUMENTATION CODES:   Underweight, Severe malnutrition in context of chronic illness  INTERVENTION:    Ensure Enlive po BID, each supplement provides 350 kcal and 20 grams of protein  Magic cup BID with meals, each supplement provides 290 kcal and 9 grams of protein  Liberalize diet  NUTRITION DIAGNOSIS:   Severe Malnutrition related to chronic illness(dementia) as evidenced by severe fat depletion, severe muscle depletion.  GOAL:   Patient will meet greater than or equal to 90% of their needs  MONITOR:   PO intake, Supplement acceptance, Weight trends, Labs  REASON FOR ASSESSMENT:   Other (Comment)(low BMI)    ASSESSMENT:   Patient with PMH significant for dementia, CHF, COPD, and ischemic heart disease s/p pacemaker. Presents this admission with COPD exacerbation.    Pt unable to answer RD questions. No family at bedside.   Sitter reports pt had a couple bites of breakfast this am. She was told by the daughter that pt has a small appetite at baseline. Meal completions charted as 25-100% for her last 6 meals (3 meal charted as 100%). Sitter to Designer, fashion/clothing.   Unable to obtain UBW. Records indicate pt weighed 107 lb on 06/12/17 and 91 lb this admission. Hard to determine how much is actual weight loss versus fluid fluctuations given CHF history.   Medications reviewed and include: 20 mg lasix once daily, remeron, 10 mEq KCl once daily, prednisone Labs reviewed.   NUTRITION - FOCUSED PHYSICAL EXAM:    Most Recent Value  Orbital Region  Moderate depletion  Upper Arm Region  Severe depletion  Thoracic and Lumbar Region  Unable to assess  Buccal Region  Severe depletion  Temple Region  Severe depletion  Clavicle Bone Region  Severe depletion  Clavicle and Acromion Bone Region  Severe depletion  Scapular Bone Region  Unable to assess  Dorsal Hand  Severe depletion  Patellar Region  Severe depletion  Anterior Thigh  Region  Severe depletion  Posterior Calf Region  Severe depletion  Edema (RD Assessment)  None  Hair  Reviewed  Eyes  Reviewed  Mouth  Reviewed  Skin  Reviewed  Nails  Reviewed     Diet Order:   Diet Order            Diet Heart Room service appropriate? Yes; Fluid consistency: Thin  Diet effective now              EDUCATION NEEDS:   Not appropriate for education at this time  Skin:  Skin Assessment: Skin Integrity Issues: Skin Integrity Issues:: Other (Comment) Other: skin tear- right hand/arm  Last BM:  PTA  Height:   Ht Readings from Last 1 Encounters:  04/07/18 5\' 3"  (1.6 m)    Weight:   Wt Readings from Last 1 Encounters:  04/07/18 41.2 kg    Ideal Body Weight:  52.3 kg  BMI:  Body mass index is 16.1 kg/m.  Estimated Nutritional Needs:   Kcal:  1350-1550 kcal  Protein:  65-80 grams  Fluid:  >/= 1.3 L/day   Vanessa Kick RD, LDN Clinical Nutrition Pager # - [phone]
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
## 2018-04-12 NOTE — Progress Notes (Signed)
Patient agitated this AM.  Sitter, RN and NT attempted to distract/soothe/reorient patient multiples times, change environment, offered food/drink/bathroom.  Patient attempting to bite and kick sitter, trying to get out of bed into hallway at this time.  PRN ativan given to patient.  She is resting comfortably now.

## 2018-04-12 NOTE — Progress Notes (Signed)
Discussed pt's disposition needs with Hassel Neth with Shawna Orleans, resident director. Confirms again facility able to manage pt's medical needs upon return, however will need pt to be sitter-free without signifcant behavioral events x24 hours before able to readmit. Will also need at DC: HHPT/OT orders on DC summary and FL2, for new and PRN medications, need instructions on why/when to administer. Will follow and assist with transition back to memory care at [facility] when appropriate.  Ilean Skill, MSW, LCSW Clinical Social Work 04/12/2018 [phone]

## 2018-04-13 DIAGNOSIS — I5032 Chronic diastolic (congestive) heart failure: Secondary | ICD-10-CM

## 2018-04-13 DIAGNOSIS — E43 Unspecified severe protein-calorie malnutrition: Secondary | ICD-10-CM
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
## 2018-04-14 DIAGNOSIS — L8990 Pressure ulcer of unspecified site, unspecified stage: Secondary | ICD-10-CM

## 2018-04-14 DIAGNOSIS — F0391 Unspecified dementia with behavioral disturbance: Secondary | ICD-10-CM

## 2018-04-14 DIAGNOSIS — Z515 Encounter for palliative care: Secondary | ICD-10-CM

## 2018-04-14 DIAGNOSIS — Z7189 Other specified counseling: Secondary | ICD-10-CM

## 2018-04-14 LAB — CREATININE, SERUM
Creatinine, Ser: 1.02 mg/dL — ABNORMAL HIGH (ref 0.44–1.00)
GFR calc Af Amer: 57 mL/min — ABNORMAL LOW (ref >60)
GFR, EST NON AFRICAN AMERICAN: 49 mL/min — AB (ref >60)

## 2018-04-14 MED ORDER — PREDNISONE 10 MG PO TABS: ORAL_TABLET | ORAL | Status: AC

## 2018-04-14 MED ORDER — OLANZAPINE 2.5 MG PO TABS: 2.5000 mg | ORAL_TABLET | Freq: Every day | ORAL | 0 refills | Status: AC | PRN

## 2018-04-14 MED ORDER — DOXYCYCLINE HYCLATE 100 MG PO TABS: 100.0000 mg | ORAL_TABLET | Freq: Two times a day (BID) | ORAL | 0 refills | Status: DC

## 2018-04-14 MED ORDER — IPRATROPIUM-ALBUTEROL 0.5-2.5 (3) MG/3ML IN SOLN: 3.0000 mL | RESPIRATORY_TRACT | 0 refills | Status: AC | PRN

## 2018-04-14 MED ORDER — OLANZAPINE 2.5 MG PO TABS: 2.5000 mg | ORAL_TABLET | Freq: Two times a day (BID) | ORAL | 0 refills | Status: AC

## 2018-04-14 NOTE — NC FL2 (Addendum)
[place] MEDICAID FL2 LEVEL OF CARE SCREENING TOOL     IDENTIFICATION  Patient Name: Kathy Solomon Birthdate: 07-Jul-1927 Sex: female Admission Date (Current Location): 04/07/2018  [facility] and [place] Number:  Producer, television/film/video and Address:  [facility],  [address] [place] [place], [place] [address]      Provider Number: [id number]  Attending Physician Name and Address:  Jerald Kief, MD  Relative Name and Phone Number:       Current Level of Care: SNF Recommended Level of Care: Memory Care Prior Approval Number:    Date Approved/Denied:   PASRR Number:    Discharge Plan: (Memory Care)    Current Diagnoses: Patient Active Problem List   Diagnosis Date Noted  . Protein-calorie malnutrition, severe 04/13/2018  . Acute on chronic respiratory failure with hypoxemia (HCC) 04/07/2018  . COPD (chronic obstructive pulmonary disease) (HCC) 04/07/2018  . Chronic diastolic CHF (congestive heart failure) (HCC) 03/14/2016  . Dementia with behavioral disturbance (HCC) 01/17/2016  . Sundowning 01/17/2016  . Complete heart block (HCC) 01/14/2016  . Ischemic heart disease 10/16/2013  . Pacemaker 10/16/2013  . Heart murmur, aortic 10/16/2013  . Left carotid bruit 10/16/2013  . Aortic valve defect 10/16/2013  . Chronic ischemic heart disease 10/16/2013  . 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 Arteriosclerosis of coronary artery 11/30/2010  . DD (diverticular disease) 11/30/2010  . HLD (hyperlipidemia) 11/30/2010  . Essential hypertension 11/30/2010  . OP (osteoporosis) 11/30/2010  . H/O cataract extraction 11/30/2010    Orientation RESPIRATION BLADDER Height & Weight     Self  Normal Incontinent Weight: 90 lb 14.4 oz (41.2 kg) Height:  5\' 3"  (160 cm)  BEHAVIORAL SYMPTOMS/MOOD NEUROLOGICAL BOWEL NUTRITION STATUS      Incontinent Diet(Regular Diet )  AMBULATORY STATUS COMMUNICATION OF NEEDS Skin   Supervision Verbally                          Personal Care Assistance Level of Assistance  Bathing, Feeding, Dressing Bathing Assistance: Maximum assistance Feeding assistance: Limited assistance  Dressing Assistance: Maximum assistance     Functional Limitations Info  Sight, Hearing, Speech Sight Info: Adequate Hearing Info: Adequate Speech Info: Adequate    SPECIAL CARE FACTORS FREQUENCY  PT (By licensed PT), OT (By licensed OT)     PT/OT-Home Health           Contractures Contractures Info: Not present    Additional Factors Info  Code Status, Allergies, Psychotropic Code Status Info: DNR  Allergies Info: Allergies: Clonidine Derivatives Psychotropic Info: Zyprexa, Seroquel, Remeron            Discharge Medications: Please see discharge summary for a list of discharge medications.  Medication List    STOP taking these medications   risperiDONE 0.25 MG tablet Commonly known as:  RISPERDAL     TAKE these medications   atenolol 100 MG tablet Commonly known as:  TENORMIN Take 100 mg by mouth daily.   clopidogrel 75 MG tablet Commonly known as:  PLAVIX Take 75 mg by mouth daily.   [employer] ACETAMINOPHEN EX ST 500 MG tablet Generic drug:  acetaminophen Take 500 mg by mouth every 6 (six) hours as needed. for pain   donepezil 5 MG tablet Commonly known as:  ARICEPT Take 5 mg by mouth at bedtime.   doxycycline 100 MG tablet Commonly known as:  VIBRA-TABS Take 1 tablet (100 mg total) by mouth every 12 (twelve) hours for 2 days.  furosemide 20 MG tablet Commonly known as:  LASIX Take 1 tablet (20 mg total) by mouth daily.   ipratropium-albuterol 0.5-2.5 (3) MG/3ML Soln Commonly known as:  DUONEB Take 3 mLs by nebulization every 4 (four) hours as needed (sob or wheezing).   lisinopril 40 MG tablet Commonly known as:  PRINIVIL,ZESTRIL Take 40 mg by mouth daily.   Melatonin 5 MG Caps Take 5 mg by mouth at bedtime as needed (insomnia).   mirtazapine 7.5 MG tablet Commonly known  as:  REMERON Take 7.5 mg by mouth at bedtime.   OLANZapine 2.5 MG tablet Commonly known as:  ZYPREXA Take 1 tablet (2.5 mg total) by mouth daily as needed (aggitation).   OLANZapine 2.5 MG tablet Commonly known as:  ZYPREXA Take 1 tablet (2.5 mg total) by mouth 2 (two) times daily for 30 days.   potassium chloride 10 MEQ tablet Commonly known as:  KLOR-CON 10 Take 1 tablet (10 mEq total) by mouth daily.   predniSONE 10 MG tablet Commonly known as:  DELTASONE Taper dose: 40mg  po daily x 2 days, then 20mg  po daily x 2 days, then 10mg  po daily x 2 days, then 5mg  po daily x 2 days, then stop, zero refills   simvastatin 20 MG tablet Commonly known as:  ZOCOR Take 20 mg by mouth daily.    Relevant Imaging Results:  Relevant Lab Results:   Additional Information SSN:  [id number]  Patient to follow up with [facility]: Psychiatrist  Patient to follow up with [facility]    [place], [place]

## 2018-04-14 NOTE — Progress Notes (Addendum)
Civil engineer, contracting [facility])  Notified of family request for [facility] services at home after discharge.  Chart and patient information under review by [facility] physician, and eligibility is pending at this time.    Spoke with [name], daughter, to initiate education related to hospice philosophy, services and team approach to care.  Family verbalized understanding of information given.    Plan is to discharge to today back to [facility].  We will have a nurse evaluate her on 3/1 @ 6pm.  Please send signed and completed DNR form home with family.  Patient will need prescriptions for discharge comfort medications.  DME needs discussed, patient currently has the following equipment: walker  Patient will need the following equipment:  Currently, none.    [facility] is aware of the above.  Completed discharge summary will need to be faxed to [facility] at [phone].  Please notify [facility] when patient is ready to leave the unit at discharge [phone] between 830-5, [phone] after 5).    [facility] information given to [place].  LCSW aware of above information.  Please call with any hospice related questions/concerns  Wallis Bamberg RN, BSN, CCRN [facility]) [facility] Liaison [phone]  **addendum--noted SNF request.  Please contact [facility] if we can help any further.
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
## 2018-04-14 NOTE — Progress Notes (Addendum)
1:00 reached out to Memory care informing them about patient discharge. Patient 24 hours sitter free.   2:50pm Patient daughter is agreeable [facility] ([facility]) to follow at [facility]. CSW arranged discharge back to [facility] with Orvil Feil, the medical technician. The FL2 completed indicating patient new medication changes, and instructions on why/when to administer.   FL2 completed and signed D/C summary sent  3:30pm CSW reached out to the patient daughter,she is concern about the patient returning to [facility] without one on one support. She reports concerns about the patient receiving her new medications. CSW reached out to the medical director, Shawna Orleans to discuss these concerns. Shawna Orleans states the patient daughter can hire private sitters to sit with the patient.   3:53pm Shawna Orleans and [employer] call CSW. Patient daughter no longer wants to pursue hospice at [facility]. The patient daughter has requested the patient go to rehab after the hospital stay and request to pursue hospice at a different time.   Csw sent initial SNF rehab facilities in the area. Patient daughter prefers [facility] or [facility] if a bed is available.   Patient will need PT/OT evaluation. FL2 changed to reflect SNF placement.    CSW notified physician, palliative team and nurse.   Vivi Barrack, [place], MSW Clinical Social Worker  [phone] 04/14/2018  2:50 PM

## 2018-04-14 NOTE — Consult Note (Signed)
Palliative care consult note  Reason for consult: Goals of care in light of dementia with continued decline  Palliative consult received.  Chart reviewed and discussed with Dr. Wyline Copas.  I saw and examined Kathy Solomon.  She is confused and cannot make her own medical decisions.  I met today with her daughter, Kathy Solomon.  I introduced palliative care as specialized medical care for people living with serious illness. It focuses on providing relief from the symptoms and stress of a serious illness. The goal is to improve quality of life for both the patient and the family.  We discussed clinical course as well as wishes moving forward in regard to advanced directives.  Concepts specific to code status and rehospitalization discussed.  We discussed difference between a aggressive medical intervention path and a palliative, comfort focused care path.  Values and goals of care important to patient and family were attempted to be elicited.  Concept of Hospice and Palliative Care were discussed  Kathy Solomon reports that she is interested in transition back to [facility] with hospice support.  Referral placed to hospice.  MOST form completed.  Total time: 75 minutes  Greater than 50%  of this time was spent counseling and coordinating care related to the above assessment and plan.  Micheline Rough, MD [place] Team [phone]

## 2018-04-14 NOTE — [place] FL2 (Signed)
[place] MEDICAID FL2 LEVEL OF CARE SCREENING TOOL     IDENTIFICATION  Patient Name: Kathy Solomon Birthdate: 1927/08/17 Sex: female Admission Date (Current Location): 04/07/2018  [facility] and [place] Number:  Producer, television/film/video and Address:  [facility]. [facility], [address] [place], [place] [address]      Provider Number: [id number]  Attending Physician Name and Address:  Jerald Kief, MD  Relative Name and Phone Number:       Current Level of Care: Hospital Recommended Level of Care: Skilled Nursing Facility Prior Approval Number:    Date Approved/Denied:   PASRR Number: [id number]  Discharge Plan: SNF    Current Diagnoses: Patient Active Problem List   Diagnosis Date Noted  . Protein-calorie malnutrition, severe 04/13/2018  . Acute on chronic respiratory failure with hypoxemia (HCC) 04/07/2018  . COPD (chronic obstructive pulmonary disease) (HCC) 04/07/2018  . Chronic diastolic CHF (congestive heart failure) (HCC) 03/14/2016  . Dementia with behavioral disturbance (HCC) 01/17/2016  . Sundowning 01/17/2016  . Complete heart block (HCC) 01/14/2016  . Ischemic heart disease 10/16/2013  . Pacemaker 10/16/2013  . Heart murmur, aortic 10/16/2013  . Left carotid bruit 10/16/2013  . Aortic valve defect 10/16/2013  . Chronic ischemic heart disease 10/16/2013  . 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 Arteriosclerosis of coronary artery 11/30/2010  . DD (diverticular disease) 11/30/2010  . HLD (hyperlipidemia) 11/30/2010  . Essential hypertension 11/30/2010  . OP (osteoporosis) 11/30/2010  . H/O cataract extraction 11/30/2010    Orientation RESPIRATION BLADDER Height & Weight     Self  Normal Continent Weight: 90 lb 14.4 oz (41.2 kg) Height:  5\' 3"  (160 cm)  BEHAVIORAL SYMPTOMS/MOOD NEUROLOGICAL BOWEL NUTRITION STATUS      Continent Diet(Regular )  AMBULATORY STATUS COMMUNICATION OF NEEDS Skin   Extensive Assist Verbally PU  Stage and Appropriate Care                       Personal Care Assistance Level of Assistance  Bathing, Feeding, Dressing Bathing Assistance: Maximum assistance Feeding assistance: Independent Dressing Assistance: Maximum assistance     Functional Limitations Info  Sight, Hearing, Speech Sight Info: Adequate Hearing Info: Adequate Speech Info: Adequate    SPECIAL CARE FACTORS FREQUENCY  PT (By licensed PT), OT (By licensed OT)     PT Frequency: 5X/week OT Frequency: 5X/week             Contractures Contractures Info: Not present    Additional Factors Info  Code Status, Allergies, Psychotropic Code Status Info: DNR Allergies Info: Allergies: Clonidine Derivatives Psychotropic Info: Zyprexa, Serpquel, Remeron          Current Medications (04/14/2018):  This is the current hospital active medication list Current Facility-Administered Medications  Medication Dose Route Frequency Provider Last Rate Last Dose  . 0.9 %  sodium chloride infusion  250 mL Intravenous PRN Rometta Emery, MD      . acetaminophen (TYLENOL) tablet 650 mg  650 mg Oral Q6H PRN Macon Large, NP   650 mg at 04/13/18 2056  . atenolol (TENORMIN) tablet 100 mg  100 mg Oral Daily Earlie Lou L, MD   100 mg at 04/14/18 0948  . clopidogrel (PLAVIX) tablet 75 mg  75 mg Oral Daily Rometta Emery, MD   75 mg at 04/14/18 0947  . donepezil (ARICEPT) tablet 5 mg  5 mg Oral QHS Rometta Emery, MD   5 mg at 04/13/18  2056  . doxycycline (VIBRA-TABS) tablet 100 mg  100 mg Oral Q12H Hall, Carole N, DO   100 mg at 04/14/18 0947  . enoxaparin (LOVENOX) injection 30 mg  30 mg Subcutaneous Daily Earlie Lou L, MD   30 mg at 04/14/18 0948  . famotidine (PEPCID) tablet 20 mg  20 mg Oral Daily Dow Adolph N, DO   20 mg at 04/14/18 0947  . feeding supplement (ENSURE ENLIVE) (ENSURE ENLIVE) liquid 237 mL  237 mL Oral BID BM Jerald Kief, MD   237 mL at 04/13/18 1316  . furosemide (LASIX)  tablet 20 mg  20 mg Oral Daily Earlie Lou L, MD   20 mg at 04/14/18 0947  . hydrALAZINE (APRESOLINE) injection 10 mg  10 mg Intravenous Q6H PRN Macon Large, NP   10 mg at 04/12/18 2116  . HYDROcodone-acetaminophen (NORCO/VICODIN) 5-325 MG per tablet 1-2 tablet  1-2 tablet Oral Q6H PRN Macon Large, NP   1 tablet at 04/11/18 [phone]  . ipratropium-albuterol (DUONEB) 0.5-2.5 (3) MG/3ML nebulizer solution 3 mL  3 mL Nebulization BID Earlie Lou L, MD   3 mL at 04/14/18 0745  . lisinopril (PRINIVIL,ZESTRIL) tablet 40 mg  40 mg Oral Daily Rometta Emery, MD   40 mg at 04/14/18 0946  . LORazepam (ATIVAN) injection 1 mg  1 mg Intravenous Q4H PRN Dow Adolph N, DO   1 mg at 04/12/18 2102  . Melatonin TABS 5 mg  5 mg Oral QHS PRN Earlie Lou L, MD      . mirtazapine (REMERON) tablet 7.5 mg  7.5 mg Oral QHS Earlie Lou L, MD   7.5 mg at 04/13/18 2057  . OLANZapine (ZYPREXA) tablet 2.5 mg  2.5 mg Oral Daily PRN Jerald Kief, MD      . OLANZapine [facility]) tablet 2.5 mg  2.5 mg Oral QHS Jerald Kief, MD   2.5 mg at 04/13/18 2057  . ondansetron (ZOFRAN) tablet 4 mg  4 mg Oral Q6H PRN Rometta Emery, MD       Or  . ondansetron (ZOFRAN) injection 4 mg  4 mg Intravenous Q6H PRN Earlie Lou L, MD      . potassium chloride (K-DUR) CR tablet 10 mEq  10 mEq Oral Daily Rometta Emery, MD   10 mEq at 04/14/18 0947  . predniSONE (DELTASONE) tablet 40 mg  40 mg Oral Q breakfast Dow Adolph N, DO   40 mg at 04/14/18 3817  . QUEtiapine (SEROQUEL) tablet 25 mg  25 mg Oral BID Dow Adolph N, DO   25 mg at 04/14/18 7116  . simvastatin (ZOCOR) tablet 20 mg  20 mg Oral q1800 Earlie Lou L, MD   20 mg at 04/13/18 1800  . sodium chloride flush (NS) 0.9 % injection 3 mL  3 mL Intravenous Q12H Rometta Emery, MD   3 mL at 04/13/18 2058  . sodium chloride flush (NS) 0.9 % injection 3 mL  3 mL Intravenous PRN Rometta Emery, MD         Discharge  Medications: Please see discharge summary for a list of discharge medications.  Relevant Imaging Results:  Relevant Lab Results:   Additional Information SSN:  [id number]  Clearance Coots, LCSW

## 2018-04-15 MED ORDER — IPRATROPIUM-ALBUTEROL 0.5-2.5 (3) MG/3ML IN SOLN: 3.0000 mL | Freq: Four times a day (QID) | RESPIRATORY_TRACT | Status: DC | PRN

## 2018-04-15 NOTE — Progress Notes (Signed)
PT Note  Patient Details Name: Kathy Solomon MRN: [id number] DOB: [birth year]      Chart reviewed and tried very hard with CNA help to encourage pt to ambulate , mobilize with [place] for PT assessment. Per nursing and chart , pt from memory care unit and has to be independent with mobility, however has had some falls.  May need higher level of care in order to get safe back on her feet. Need to assess mobility.  Tried today however pt was pleasantly refusing stating " not today, I have had a big day, let's try tomorrow".  Checked on pt a second time and she was eating dinner.  Will check back tomorrow morning.   Marella Bile 04/15/2018, 6:12 PM  Marella Bile, PT Acute Rehabilitation Services Pager: [phone] Office: [phone] 04/15/2018
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
## 2018-04-15 NOTE — Progress Notes (Signed)
LCSW spoke with pt daughter who expressed concerns about dc plan.  LCSW updated daughter on dc plan and notified her that a PT/OT consult was pending and needed for pt to dc to SNF.   Daughter had concerns about SNF and hospice. LCSW explained that patient could go to SNF wit palliative and transition to hospice once back in her facility. Daughter expressed understanding.   Beulah Gandy [place] CSW [phone]

## 2018-04-16 DIAGNOSIS — W19XXXA Unspecified fall, initial encounter: Secondary | ICD-10-CM

## 2018-04-16 NOTE — Progress Notes (Signed)
Provided SNF bed offers to pt's daughter. CSW and daughter discussed her wishes and goals of having pt admit to a SNF at length throughout the day. She states she "has accepted that mom isn't going to get well, but I want to see if she'll be able to use her legs and care for herself enough to go back to [facility]. We want her under palliative care definitely, we realize there's nothing more the hospital needs to treat her for." Discussed again options of SNF versus returning directly to [facility] and daughter desires SNF to attempt participating in therapy prior to determining their next goal (?hospice). [place] resident coordinator aware of plan to admit to SNF (once selected) and will follow pt there to assist with continued planning for her long term care.   Ilean Skill, MSW, LCSW Clinical Social Work 04/16/2018 [phone]
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
## 2018-04-16 NOTE — Evaluation (Signed)
Physical Therapy Evaluation Patient Details Name: Kathy Solomon MRN: [id number] DOB: [birth year] Today's Date: 04/16/2018   History of Present Illness  85 y.o. female with medical history significant of dementia, CHF, COPD, ischemic heart disease status post pacemaker placement who is a resident of skilled nursing facility memory unit that was found today after she sustained a fall.  Dx of acute on chronic respiratory failure.   Clinical Impression  Pt admitted with above diagnosis. Pt currently with functional limitations due to the deficits listed below (see PT Problem List). Pt ambulated 68' with RW, verbal cues for safe positioning in RW and min assist to manage turns. SaO2 95% on room air, no dyspnea. Pt is oriented to self only. Due to confusion and recent fall, 24* supervision is recommended.  Pt will benefit from skilled PT to increase their independence and safety with mobility to allow discharge to the venue listed below.       Follow Up Recommendations Supervision/Assistance - 24 hour;Supervision for mobility/OOB;SNF    Equipment Recommendations  None recommended by PT    Recommendations for Other Services       Precautions / Restrictions Precautions Precautions: Fall Precaution Comments: pt admitted with a fall, pt unable to provide fall hx 2* dementia, no family present Restrictions Weight Bearing Restrictions: No      Mobility  Bed Mobility               General bed mobility comments: up in recliner  Transfers Overall transfer level: Needs assistance Equipment used: Rolling walker (2 wheeled) Transfers: Sit to/from Stand Sit to Stand: Min assist         General transfer comment: min assist and VCs to scoot to edge of recliner, manual/verbal cues for hand placement on armrest, min assist to rise  Ambulation/Gait Ambulation/Gait assistance: Min assist Gait Distance (Feet): 90 Feet Assistive device: Rolling walker (2 wheeled) Gait Pattern/deviations:  Decreased stride length;Trunk flexed Gait velocity: WFL   General Gait Details: no loss of balance, no dyspnea, SaO2 95% on room air, min assist to maneuver RW with turns, VCs for safe positioning in RW  Stairs            Wheelchair Mobility    Modified Rankin (Stroke Patients Only)       Balance Overall balance assessment: History of Falls;Needs assistance   Sitting balance-Leahy Scale: Good       Standing balance-Leahy Scale: Fair                               Pertinent Vitals/Pain Pain Assessment: Faces Faces Pain Scale: Hurts little more Pain Location: back Pain Descriptors / Indicators: Aching Pain Intervention(s): Limited activity within patient's tolerance;Monitored during session;Repositioned;Patient requesting pain meds-RN notified    Home Living Family/patient expects to be discharged to:: Skilled nursing facility([facility])                 Additional Comments: Pt admitted from [facility]    Prior Function           Comments: unknown, no family present, pt poor historian 2* dementia, pt did state she uses a RW     Higher education careers adviser        Extremity/Trunk Assessment   Upper Extremity Assessment Upper Extremity Assessment: Overall WFL for tasks assessed    Lower Extremity Assessment Lower Extremity Assessment: Overall WFL for tasks assessed    Cervical / Trunk Assessment Cervical / Trunk  Assessment: Kyphotic  Communication   Communication: No difficulties  Cognition Arousal/Alertness: Awake/alert Behavior During Therapy: WFL for tasks assessed/performed Overall Cognitive Status: No family/caregiver present to determine baseline cognitive functioning                                 General Comments: h/o dementia per chart; pt oriented to self only, can follow 1 step commands, thinks she's in a military facility, pt is pleasant      General Comments      Exercises     Assessment/Plan     PT Assessment Patient needs continued PT services  PT Problem List Decreased mobility;Decreased activity tolerance;Decreased balance       PT Treatment Interventions Gait training;Functional mobility training;Therapeutic exercise;Patient/family education;Stair training;Therapeutic activities    PT Goals (Current goals can be found in the Care Plan section)  Acute Rehab PT Goals PT Goal Formulation: Patient unable to participate in goal setting Time For Goal Achievement: 04/30/18 Potential to Achieve Goals: Good    Frequency Min 2X/week   Barriers to discharge        Co-evaluation               AM-PAC PT "6 Clicks" Mobility  Outcome Measure Help needed turning from your back to your side while in a flat bed without using bedrails?: A Little Help needed moving from lying on your back to sitting on the side of a flat bed without using bedrails?: A Little Help needed moving to and from a bed to a chair (including a wheelchair)?: A Little Help needed standing up from a chair using your arms (e.g., wheelchair or bedside chair)?: A Little Help needed to walk in hospital room?: A Little Help needed climbing 3-5 steps with a railing? : A Lot 6 Click Score: 17    End of Session Equipment Utilized During Treatment: Gait belt Activity Tolerance: Patient tolerated treatment well Patient left: in chair;with chair alarm set;with call bell/phone within reach Nurse Communication: Mobility status PT Visit Diagnosis: Difficulty in walking, not elsewhere classified (R26.2);History of falling (Z91.81);Unsteadiness on feet (R26.81)    Time: 3086-5784 PT Time Calculation (min) (ACUTE ONLY): 14 min   Charges:   PT Evaluation $PT Eval Low Complexity: 1 Low          Ralene Bathe Kistler PT 04/16/2018  Acute Rehabilitation Services Pager [phone] Office [phone]

## 2018-04-16 NOTE — Plan of Care (Signed)

## 2018-04-16 NOTE — Care Management Important Message (Signed)
Important Message  Patient Details  Name: Merle Hauck MRN: [id number] Date of Birth: 08-20-1927   Medicare Important Message Given:  Yes    Caren Macadam 04/16/2018, 11:33 AMImportant Message  Patient Details  Name: Anjenette Jagoe MRN: [id number] Date of Birth: 19-Mar-1927   Medicare Important Message Given:  Yes    Caren Macadam 04/16/2018, 11:33 AM

## 2018-04-16 NOTE — Evaluation (Signed)
Occupational Therapy Evaluation Patient Details Name: Kathy Solomon MRN: [id number] DOB: Aug 10, 1927 Today's Date: 04/16/2018    History of Present Illness 85 y.o. female with medical history significant of dementia, CHF, COPD, ischemic heart disease status post pacemaker placement who is a resident of skilled nursing facility memory unit that was found today after she sustained a fall.  Dx of acute on chronic respiratory failure.    Clinical Impression   Pt admitted s/p fall.   Pt currently with functional limitations due to the deficits listed below (see OT Problem List).  Pt will benefit from skilled OT to increase their safety and independence with ADL and functional mobility for ADL to facilitate discharge to venue listed below.     Follow Up Recommendations  SNF    Equipment Recommendations  None recommended by OT    Recommendations for Other Services       Precautions / Restrictions Precautions Precautions: Fall Precaution Comments: pt admitted with a fall, pt unable to provide fall hx 2* dementia, no family present Restrictions Weight Bearing Restrictions: No      Mobility Bed Mobility Overal bed mobility: Needs Assistance Bed Mobility: Supine to Sit     Supine to sit: Min assist        Transfers Overall transfer level: Needs assistance Equipment used: Rolling walker (2 wheeled) Transfers: Sit to/from [employer] Sit to Stand: Min assist Stand pivot transfers: Min assist            Balance Overall balance assessment: History of Falls;Needs assistance   Sitting balance-Leahy Scale: Good       Standing balance-Leahy Scale: Fair                             ADL either performed or assessed with clinical judgement   ADL Overall ADL's : Needs assistance/impaired Eating/Feeding: Sitting;Set up   Grooming: Minimal assistance;Sitting   Upper Body Bathing: Minimal assistance;Sitting   Lower Body Bathing: Maximal  assistance;Sit to/from stand;Cueing for sequencing;Cueing for safety   Upper Body Dressing : Minimal assistance;Sitting   Lower Body Dressing: Maximal assistance;Sit to/from stand;Cueing for sequencing;Cueing for safety   Toilet Transfer: Minimal assistance;RW;BSC;Comfort height toilet;Cueing for sequencing;Stand-pivot   Toileting- Clothing Manipulation and Hygiene: Minimal assistance;Sit to/from stand;Cueing for sequencing;Cueing for safety               Vision Patient Visual Report: No change from baseline              Pertinent Vitals/Pain Pain Score: 0-No pain     Hand Dominance     Extremity/Trunk Assessment Upper Extremity Assessment Upper Extremity Assessment: Generalized weakness       Cervical / Trunk Assessment Cervical / Trunk Assessment: Kyphotic   Communication Communication Communication: No difficulties   Cognition Arousal/Alertness: Awake/alert Behavior During Therapy: WFL for tasks assessed/performed Overall Cognitive Status: No family/caregiver present to determine baseline cognitive functioning                                 General Comments: h/o dementia per chart; pt oriented to self only, can follow 1 step commands, thinks she's in a military facility, pt is pleasant   General Comments               Home Living Family/patient expects to be discharged to:: Skilled nursing facility([facility])  Additional Comments: Pt admitted from [facility]      Prior Functioning/Environment          Comments: unknown, no family present, pt poor historian 2* dementia, pt did state she uses a RW        OT Problem List: Decreased strength;Decreased activity tolerance;Impaired balance (sitting and/or standing);Decreased knowledge of use of DME or AE;Decreased safety awareness      OT Treatment/Interventions: Self-care/ADL training;Patient/family education;DME and/or AE  instruction    OT Goals(Current goals can be found in the care plan section) Acute Rehab OT Goals Patient Stated Goal: did not state OT Goal Formulation: With patient Time For Goal Achievement: 04/30/18  OT Frequency: Min 2X/week              AM-PAC OT "6 Clicks" Daily Activity     Outcome Measure Help from another person eating meals?: A Little Help from another person taking care of personal grooming?: A Little Help from another person toileting, which includes using toliet, bedpan, or urinal?: A Lot Help from another person bathing (including washing, rinsing, drying)?: A Lot Help from another person to put on and taking off regular upper body clothing?: A Little Help from another person to put on and taking off regular lower body clothing?: A Lot 6 Click Score: 15   End of Session Equipment Utilized During Treatment: Rolling walker Nurse Communication: Mobility status  Activity Tolerance: Patient tolerated treatment well Patient left: in chair;with call bell/phone within reach  OT Visit Diagnosis: Unsteadiness on feet (R26.81);Muscle weakness (generalized) (M62.81);Other abnormalities of gait and mobility (R26.89)                Time: 2440-1027 OT Time Calculation (min): 21 min Charges:  OT General Charges $OT Visit: 1 Visit OT Evaluation $OT Eval Moderate Complexity: 1 Mod  Lise Auer, OT Acute Rehabilitation Services [phone] Office- [phone]     Kali Deadwyler, Karin Golden D 04/16/2018, 4:35 PM
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
## 2018-04-17 NOTE — Progress Notes (Addendum)
15:17-Report #for [facility] [phone]. Arranged [facility] transportation and pt's daughter aware.  Pt will be admitting to [facility] today. Will coordinate transition there at DC.  Pt's daughter aware and awaiting contact when transport being arranged.  Ilean Skill, MSW, LCSW Clinical Social Work 04/17/2018 [phone]

## 2018-04-18 ENCOUNTER — Telehealth: Payer: Self-pay | Admitting: Cardiology

## 2018-04-18 NOTE — Telephone Encounter (Signed)
Tell no further remotes are necessary and our prayers are with them in this very hard time

## 2018-04-18 NOTE — Telephone Encounter (Signed)
Patient daughter calling to cancel remote appt b/c patient is on hospice and patient told her that she is ready to pass on. Patient needs some guidance as what to do with future remote appts. Does she continue to do complete the remotes or do they discontinue the remote checks. Informed her that I would send message to MD and ask for recommendations / guidance on this matter. Patient verbalized understanding and agreed with this appointment. Call her cell phone at [phone]

## 2018-04-19 NOTE — Telephone Encounter (Signed)
Spoke w/ pt daughter and notified her of MD recommendations and prayers during this time. Pt daughter verbalized understanding.

## 2018-06-27 ENCOUNTER — Telehealth: Payer: Self-pay | Admitting: Cardiology

## 2018-06-27 NOTE — Telephone Encounter (Signed)
Pts daughter called and stated that pt passed away over the weekend. She wanted to express her thanks for all the great care that her mother received. She wanted Dr. Graciela Husbands to know how grateful she is for his care of her mother.

## 2018-06-28 NOTE — Telephone Encounter (Signed)
noted 

## 2018-07-16 DEATH — deceased

## 2018-10-25 ENCOUNTER — Telehealth: Payer: Self-pay | Admitting: Cardiovascular Disease

## 2018-10-25 NOTE — Telephone Encounter (Signed)
Patient deceased per [place] on DPR.

## 2018-10-25 NOTE — Telephone Encounter (Signed)
L mom to verify if patient is still being followed by [facility]. EP appointments have been cancelled. Deleted recall.

## 2019-11-18 IMAGING — CR DG CHEST 2V
2 series · 2 of 2 positions shown · non-contrast
Comparison: 01/14/2016

CLINICAL DATA: Suspected sepsis.

EXAM:
CHEST - 2 VIEW

[w chest lat]
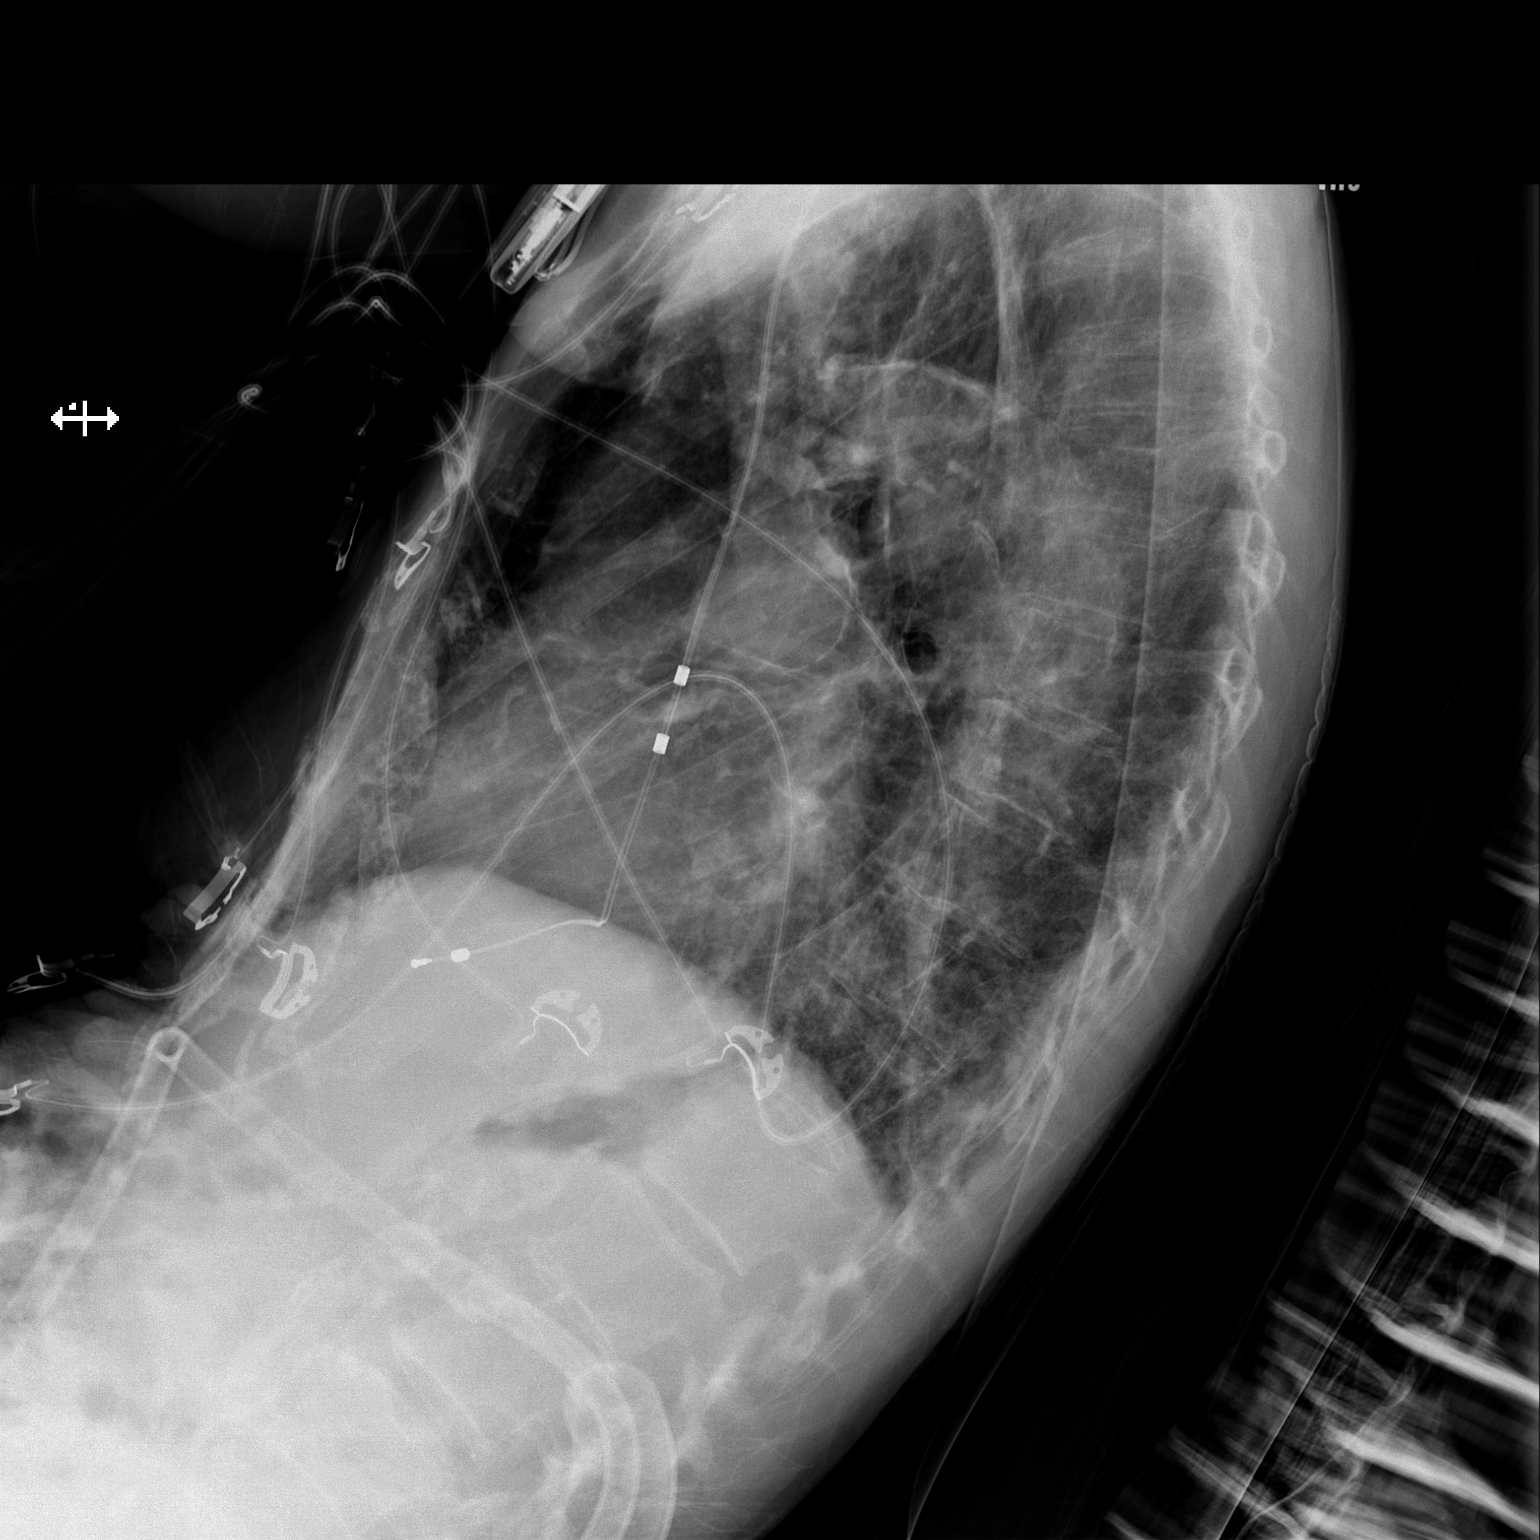

[t chest supine]
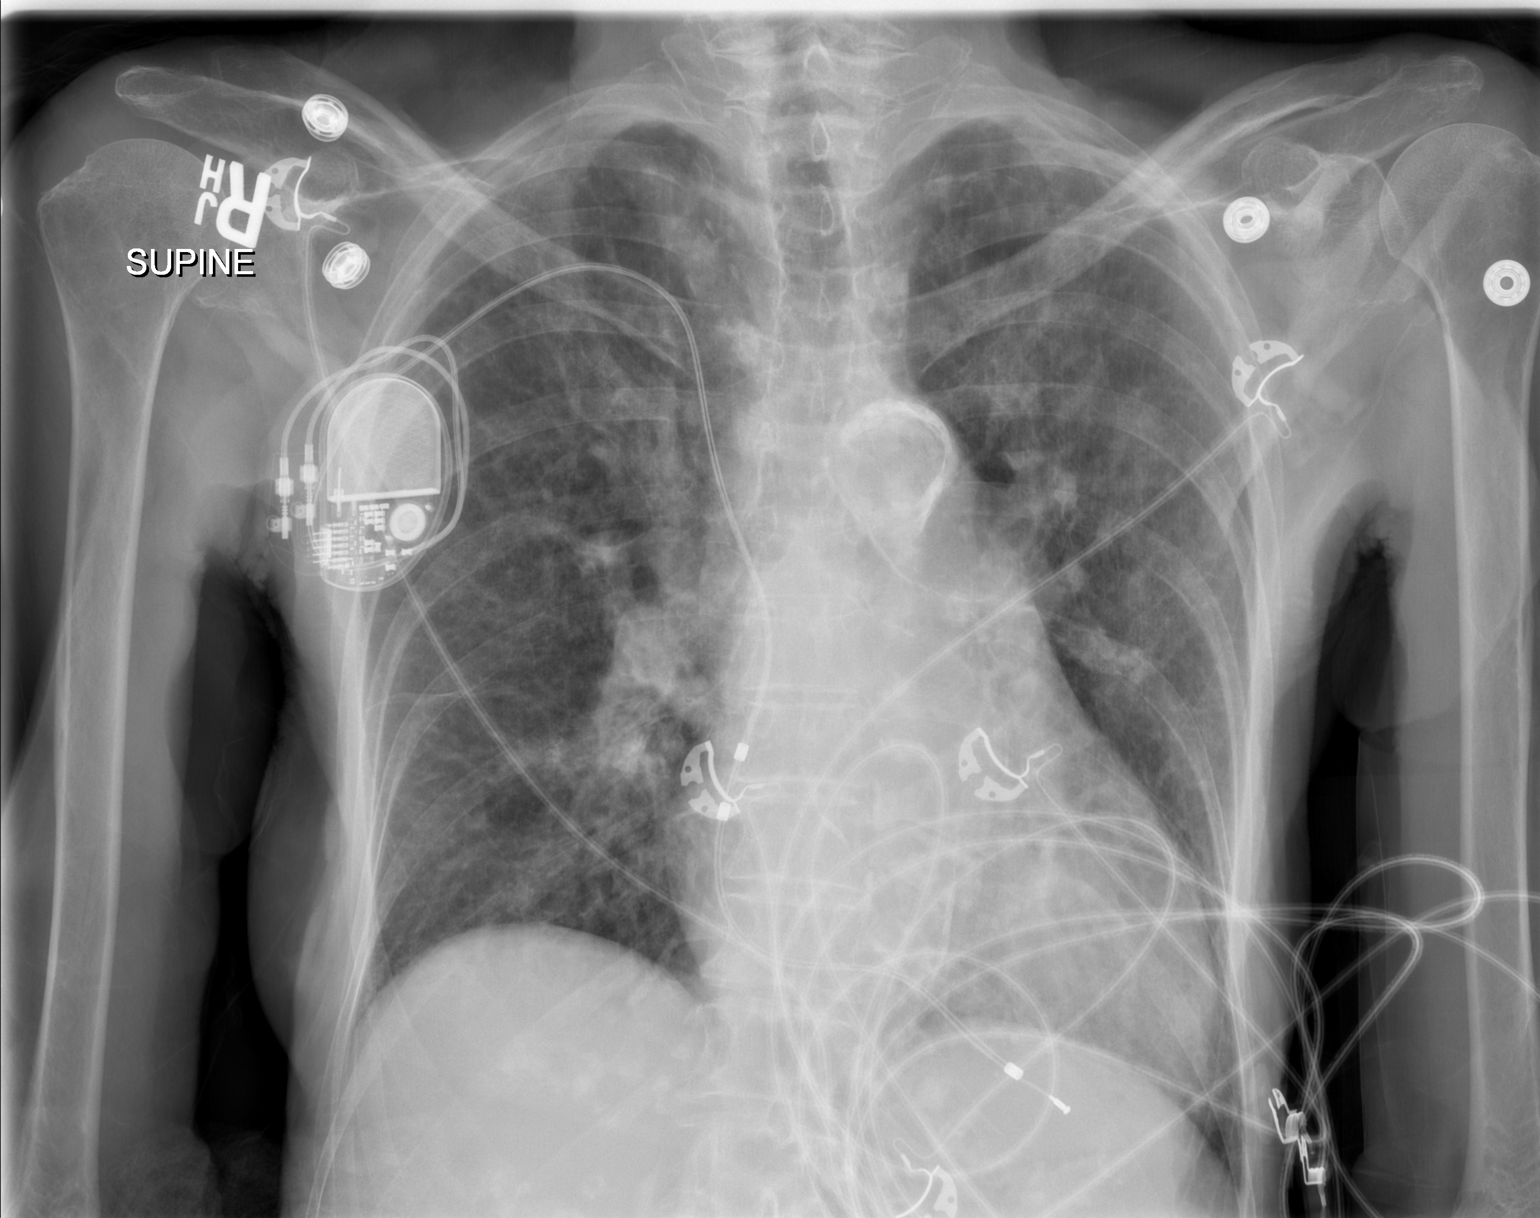

[2 of 2 positions shown; findings below may reference images not displayed]

FINDINGS: Right pacer remains in place, unchanged. Cardiomegaly with vascular
congestion. Increased interstitial markings throughout the lungs,
favor chronic interstitial lung disease/fibrosis. Findings are
similar to prior study. No confluent airspace opacities or
effusions.
IMPRESSION: Cardiomegaly. Chronic coarsened interstitial opacities throughout
the lungs, favor chronic interstitial lung disease/fibrosis. No
definite acute cardiopulmonary disease.

Densely calcified aortic arch.
